# Patient Record
Sex: Female | Born: 1970 | Race: Black or African American | Hispanic: No | Marital: Married | Smoking: Never smoker
Health system: Southern US, Community
[De-identification: ages and names within clinical notes are randomized; demographics above are authoritative.]

## PROBLEM LIST (undated history)

## (undated) DIAGNOSIS — E119 Type 2 diabetes mellitus without complications: Secondary | ICD-10-CM

## (undated) DIAGNOSIS — I10 Essential (primary) hypertension: Secondary | ICD-10-CM

## (undated) HISTORY — PX: TRIGGER FINGER RELEASE: SHX641

---

## 1999-05-13 ENCOUNTER — Emergency Department (HOSPITAL_COMMUNITY): Admission: EM | Admit: 1999-05-13 | Discharge: 1999-05-13 | Payer: Self-pay | Admitting: Emergency Medicine

## 2001-05-05 ENCOUNTER — Emergency Department (HOSPITAL_COMMUNITY): Admission: EM | Admit: 2001-05-05 | Discharge: 2001-05-06 | Payer: Self-pay | Admitting: Emergency Medicine

## 2001-05-13 ENCOUNTER — Other Ambulatory Visit: Admission: RE | Admit: 2001-05-13 | Discharge: 2001-05-13 | Payer: Self-pay | Admitting: Family Medicine

## 2001-08-23 ENCOUNTER — Inpatient Hospital Stay (HOSPITAL_COMMUNITY): Admission: AD | Admit: 2001-08-23 | Discharge: 2001-08-23 | Payer: Self-pay | Admitting: Obstetrics

## 2001-08-24 ENCOUNTER — Encounter: Payer: Self-pay | Admitting: Obstetrics and Gynecology

## 2001-09-03 ENCOUNTER — Other Ambulatory Visit: Admission: RE | Admit: 2001-09-03 | Discharge: 2001-09-03 | Payer: Self-pay | Admitting: Obstetrics and Gynecology

## 2001-11-12 ENCOUNTER — Inpatient Hospital Stay (HOSPITAL_COMMUNITY): Admission: AD | Admit: 2001-11-12 | Discharge: 2001-11-15 | Payer: Self-pay | Admitting: Obstetrics and Gynecology

## 2001-11-15 ENCOUNTER — Encounter: Payer: Self-pay | Admitting: Obstetrics and Gynecology

## 2002-01-03 ENCOUNTER — Inpatient Hospital Stay (HOSPITAL_COMMUNITY): Admission: AD | Admit: 2002-01-03 | Discharge: 2002-01-03 | Payer: Self-pay | Admitting: Obstetrics & Gynecology

## 2002-01-04 ENCOUNTER — Inpatient Hospital Stay (HOSPITAL_COMMUNITY): Admission: AD | Admit: 2002-01-04 | Discharge: 2002-01-04 | Payer: Self-pay | Admitting: *Deleted

## 2002-01-26 ENCOUNTER — Inpatient Hospital Stay (HOSPITAL_COMMUNITY): Admission: AD | Admit: 2002-01-26 | Discharge: 2002-01-26 | Payer: Self-pay | Admitting: Obstetrics & Gynecology

## 2002-02-22 ENCOUNTER — Inpatient Hospital Stay (HOSPITAL_COMMUNITY): Admission: AD | Admit: 2002-02-22 | Discharge: 2002-02-22 | Payer: Self-pay | Admitting: Obstetrics and Gynecology

## 2002-03-07 ENCOUNTER — Inpatient Hospital Stay (HOSPITAL_COMMUNITY): Admission: AD | Admit: 2002-03-07 | Discharge: 2002-03-09 | Payer: Self-pay | Admitting: Obstetrics and Gynecology

## 2002-04-15 ENCOUNTER — Other Ambulatory Visit: Admission: RE | Admit: 2002-04-15 | Discharge: 2002-04-15 | Payer: Self-pay | Admitting: Obstetrics and Gynecology

## 2003-06-19 ENCOUNTER — Other Ambulatory Visit: Admission: RE | Admit: 2003-06-19 | Discharge: 2003-06-19 | Payer: Self-pay | Admitting: Obstetrics and Gynecology

## 2005-12-25 ENCOUNTER — Encounter: Admission: RE | Admit: 2005-12-25 | Discharge: 2005-12-25 | Payer: Self-pay | Admitting: Family Medicine

## 2006-05-11 ENCOUNTER — Other Ambulatory Visit: Admission: RE | Admit: 2006-05-11 | Discharge: 2006-05-11 | Payer: Self-pay | Admitting: Family Medicine

## 2007-05-30 ENCOUNTER — Inpatient Hospital Stay (HOSPITAL_COMMUNITY): Admission: AD | Admit: 2007-05-30 | Discharge: 2007-05-30 | Payer: Self-pay | Admitting: Obstetrics and Gynecology

## 2007-06-16 ENCOUNTER — Encounter (INDEPENDENT_AMBULATORY_CARE_PROVIDER_SITE_OTHER): Payer: Self-pay | Admitting: Obstetrics and Gynecology

## 2007-06-16 ENCOUNTER — Ambulatory Visit (HOSPITAL_COMMUNITY): Admission: RE | Admit: 2007-06-16 | Discharge: 2007-06-16 | Payer: Self-pay | Admitting: Obstetrics and Gynecology

## 2008-11-01 ENCOUNTER — Encounter (INDEPENDENT_AMBULATORY_CARE_PROVIDER_SITE_OTHER): Payer: Self-pay | Admitting: Obstetrics and Gynecology

## 2008-11-01 ENCOUNTER — Ambulatory Visit (HOSPITAL_COMMUNITY): Admission: RE | Admit: 2008-11-01 | Discharge: 2008-11-01 | Payer: Self-pay | Admitting: Obstetrics and Gynecology

## 2009-01-09 ENCOUNTER — Ambulatory Visit: Payer: Self-pay | Admitting: Hematology and Oncology

## 2009-01-11 LAB — CBC WITH DIFFERENTIAL/PLATELET
BASO%: 1.4 % (ref 0.0–2.0)
Basophils Absolute: 0.1 10*3/uL (ref 0.0–0.1)
EOS%: 2.8 % (ref 0.0–7.0)
Eosinophils Absolute: 0.2 10*3/uL (ref 0.0–0.5)
HCT: 43.3 % (ref 34.8–46.6)
HGB: 14.4 g/dL (ref 11.6–15.9)
LYMPH%: 16.4 % (ref 14.0–49.7)
MCH: 28.6 pg (ref 25.1–34.0)
MCHC: 33.2 g/dL (ref 31.5–36.0)
MCV: 86 fL (ref 79.5–101.0)
MONO#: 0.5 10*3/uL (ref 0.1–0.9)
MONO%: 6.2 % (ref 0.0–14.0)
NEUT#: 6 10*3/uL (ref 1.5–6.5)
NEUT%: 73.2 % (ref 38.4–76.8)
Platelets: 209 10*3/uL (ref 145–400)
RBC: 5.03 10*6/uL (ref 3.70–5.45)
RDW: 13.5 % (ref 11.2–14.5)
WBC: 8.1 10*3/uL (ref 3.9–10.3)
lymph#: 1.3 10*3/uL (ref 0.9–3.3)

## 2009-01-15 LAB — COMPREHENSIVE METABOLIC PANEL
ALT: 16 U/L (ref 0–35)
AST: 16 U/L (ref 0–37)
Albumin: 4.2 g/dL (ref 3.5–5.2)
Alkaline Phosphatase: 106 U/L (ref 39–117)
BUN: 16 mg/dL (ref 6–23)
CO2: 27 mEq/L (ref 19–32)
Calcium: 9.2 mg/dL (ref 8.4–10.5)
Chloride: 103 mEq/L (ref 96–112)
Creatinine, Ser: 1.08 mg/dL (ref 0.40–1.20)
Glucose, Bld: 109 mg/dL — ABNORMAL HIGH (ref 70–99)
Potassium: 4 mEq/L (ref 3.5–5.3)
Sodium: 139 mEq/L (ref 135–145)
Total Bilirubin: 0.5 mg/dL (ref 0.3–1.2)
Total Protein: 7.2 g/dL (ref 6.0–8.3)

## 2009-01-15 LAB — HYPERCOAGULABLE PANEL, COMPREHENSIVE RET.
AntiThromb III Func: 107 % (ref 76–126)
Anticardiolipin IgA: 8 [APL'U] (ref <13)
Anticardiolipin IgG: <7 [GPL'U] (ref <11)
Anticardiolipin IgM: 23 [MPL'U] (ref <10)
Beta-2 Glyco I IgG: <4 U/mL (ref <20)
Beta-2-Glycoprotein I IgA: 9 U/mL (ref <10)
Beta-2-Glycoprotein I IgM: 10 U/mL (ref <10)
DRVVT 1:1 Mix: 42.1 secs (ref 36.1–47.0)
DRVVT: 50.3 secs — ABNORMAL HIGH (ref 36.1–47.0)
Homocysteine: 10.5 umol/L (ref 4.0–15.4)
PTT Lupus Anticoagulant: 47.7 secs (ref 36.3–48.8)
Protein C Activity: 139 % — ABNORMAL HIGH (ref 75–133)
Protein C, Total: 74 % (ref 70–140)
Protein S Activity: 137 % — ABNORMAL HIGH (ref 69–129)
Protein S Ag, Total: 106 % (ref 70–140)

## 2009-01-15 LAB — TSH: TSH: 0.8 u[IU]/mL (ref 0.350–4.500)

## 2009-05-11 ENCOUNTER — Ambulatory Visit (HOSPITAL_COMMUNITY): Admission: RE | Admit: 2009-05-11 | Discharge: 2009-05-11 | Payer: Self-pay | Admitting: Obstetrics and Gynecology

## 2010-01-04 ENCOUNTER — Ambulatory Visit: Payer: Self-pay | Admitting: Hematology and Oncology

## 2010-01-04 LAB — CBC WITH DIFFERENTIAL/PLATELET
BASO%: 0.1 % (ref 0.0–2.0)
Basophils Absolute: 0 10*3/uL (ref 0.0–0.1)
EOS%: 1.4 % (ref 0.0–7.0)
Eosinophils Absolute: 0.1 10*3/uL (ref 0.0–0.5)
HCT: 42 % (ref 34.8–46.6)
HGB: 14 g/dL (ref 11.6–15.9)
LYMPH%: 12.7 % — ABNORMAL LOW (ref 14.0–49.7)
MCH: 29 pg (ref 25.1–34.0)
MCHC: 33.3 g/dL (ref 31.5–36.0)
MCV: 87.2 fL (ref 79.5–101.0)
MONO#: 0.8 10*3/uL (ref 0.1–0.9)
MONO%: 7.3 % (ref 0.0–14.0)
NEUT#: 8.2 10*3/uL — ABNORMAL HIGH (ref 1.5–6.5)
NEUT%: 78.5 % — ABNORMAL HIGH (ref 38.4–76.8)
Platelets: 189 10*3/uL (ref 145–400)
RBC: 4.82 10*6/uL (ref 3.70–5.45)
RDW: 14.4 % (ref 11.2–14.5)
WBC: 10.4 10*3/uL — ABNORMAL HIGH (ref 3.9–10.3)
lymph#: 1.3 10*3/uL (ref 0.9–3.3)

## 2010-01-04 LAB — COMPREHENSIVE METABOLIC PANEL
ALT: 25 U/L (ref 0–35)
AST: 27 U/L (ref 0–37)
Albumin: 3.7 g/dL (ref 3.5–5.2)
Alkaline Phosphatase: 65 U/L (ref 39–117)
BUN: 9 mg/dL (ref 6–23)
CO2: 25 mEq/L (ref 19–32)
Calcium: 8.9 mg/dL (ref 8.4–10.5)
Chloride: 105 mEq/L (ref 96–112)
Creatinine, Ser: 1 mg/dL (ref 0.40–1.20)
Glucose, Bld: 77 mg/dL (ref 70–99)
Potassium: 3.8 mEq/L (ref 3.5–5.3)
Sodium: 137 mEq/L (ref 135–145)
Total Bilirubin: 0.6 mg/dL (ref 0.3–1.2)
Total Protein: 7 g/dL (ref 6.0–8.3)

## 2010-01-25 ENCOUNTER — Ambulatory Visit (HOSPITAL_COMMUNITY): Admission: RE | Admit: 2010-01-25 | Discharge: 2010-01-25 | Payer: Self-pay | Admitting: Obstetrics and Gynecology

## 2010-02-05 ENCOUNTER — Ambulatory Visit: Payer: Self-pay | Admitting: Hematology and Oncology

## 2010-02-06 LAB — CBC WITH DIFFERENTIAL/PLATELET
BASO%: 0.3 % (ref 0.0–2.0)
Basophils Absolute: 0 10*3/uL (ref 0.0–0.1)
EOS%: 1.6 % (ref 0.0–7.0)
Eosinophils Absolute: 0.2 10*3/uL (ref 0.0–0.5)
HCT: 40.5 % (ref 34.8–46.6)
HGB: 13.6 g/dL (ref 11.6–15.9)
LYMPH%: 12.9 % — ABNORMAL LOW (ref 14.0–49.7)
MCH: 28.7 pg (ref 25.1–34.0)
MCHC: 33.4 g/dL (ref 31.5–36.0)
MCV: 85.8 fL (ref 79.5–101.0)
MONO#: 0.7 10*3/uL (ref 0.1–0.9)
MONO%: 7 % (ref 0.0–14.0)
NEUT#: 7.3 10*3/uL — ABNORMAL HIGH (ref 1.5–6.5)
NEUT%: 78.2 % — ABNORMAL HIGH (ref 38.4–76.8)
Platelets: 164 10*3/uL (ref 145–400)
RBC: 4.72 10*6/uL (ref 3.70–5.45)
RDW: 14 % (ref 11.2–14.5)
WBC: 9.3 10*3/uL (ref 3.9–10.3)
lymph#: 1.2 10*3/uL (ref 0.9–3.3)

## 2010-02-06 LAB — BASIC METABOLIC PANEL
BUN: 9 mg/dL (ref 6–23)
CO2: 26 mEq/L (ref 19–32)
Calcium: 8.9 mg/dL (ref 8.4–10.5)
Chloride: 105 mEq/L (ref 96–112)
Creatinine, Ser: 1.03 mg/dL (ref 0.40–1.20)
Glucose, Bld: 94 mg/dL (ref 70–99)
Potassium: 3.9 mEq/L (ref 3.5–5.3)
Sodium: 137 mEq/L (ref 135–145)

## 2010-03-02 ENCOUNTER — Inpatient Hospital Stay (HOSPITAL_COMMUNITY): Admission: AD | Admit: 2010-03-02 | Discharge: 2010-03-03 | Payer: Self-pay | Admitting: Obstetrics and Gynecology

## 2010-03-02 ENCOUNTER — Encounter (INDEPENDENT_AMBULATORY_CARE_PROVIDER_SITE_OTHER): Payer: Self-pay | Admitting: Obstetrics and Gynecology

## 2010-03-05 LAB — CBC WITH DIFFERENTIAL/PLATELET
BASO%: 0.3 % (ref 0.0–2.0)
Basophils Absolute: 0 10*3/uL (ref 0.0–0.1)
EOS%: 3.3 % (ref 0.0–7.0)
Eosinophils Absolute: 0.3 10*3/uL (ref 0.0–0.5)
HCT: 34.8 % (ref 34.8–46.6)
HGB: 11.7 g/dL (ref 11.6–15.9)
LYMPH%: 15.1 % (ref 14.0–49.7)
MCH: 29.1 pg (ref 25.1–34.0)
MCHC: 33.5 g/dL (ref 31.5–36.0)
MCV: 86.7 fL (ref 79.5–101.0)
MONO#: 0.7 10*3/uL (ref 0.1–0.9)
MONO%: 7.5 % (ref 0.0–14.0)
NEUT#: 6.7 10*3/uL — ABNORMAL HIGH (ref 1.5–6.5)
NEUT%: 73.8 % (ref 38.4–76.8)
Platelets: 181 10*3/uL (ref 145–400)
RBC: 4.02 10*6/uL (ref 3.70–5.45)
RDW: 14.3 % (ref 11.2–14.5)
WBC: 9.1 10*3/uL (ref 3.9–10.3)
lymph#: 1.4 10*3/uL (ref 0.9–3.3)

## 2010-03-29 DEATH — deceased

## 2010-09-17 ENCOUNTER — Encounter
Admission: RE | Admit: 2010-09-17 | Discharge: 2010-09-17 | Payer: Self-pay | Source: Home / Self Care | Attending: Obstetrics and Gynecology | Admitting: Obstetrics and Gynecology

## 2010-11-20 ENCOUNTER — Other Ambulatory Visit: Payer: Self-pay | Admitting: Hematology and Oncology

## 2010-11-20 ENCOUNTER — Encounter (HOSPITAL_BASED_OUTPATIENT_CLINIC_OR_DEPARTMENT_OTHER): Payer: Self-pay | Admitting: Hematology and Oncology

## 2010-11-20 DIAGNOSIS — Z7901 Long term (current) use of anticoagulants: Secondary | ICD-10-CM

## 2010-11-20 DIAGNOSIS — R894 Abnormal immunological findings in specimens from other organs, systems and tissues: Secondary | ICD-10-CM

## 2010-11-20 LAB — COMPREHENSIVE METABOLIC PANEL
ALT: 23 U/L (ref 0–35)
AST: 23 U/L (ref 0–37)
Albumin: 3.9 g/dL (ref 3.5–5.2)
Alkaline Phosphatase: 82 U/L (ref 39–117)
BUN: 11 mg/dL (ref 6–23)
CO2: 21 mEq/L (ref 19–32)
Calcium: 9.4 mg/dL (ref 8.4–10.5)
Chloride: 104 mEq/L (ref 96–112)
Creatinine, Ser: 0.94 mg/dL (ref 0.40–1.20)
Glucose, Bld: 79 mg/dL (ref 70–99)
Potassium: 4.4 mEq/L (ref 3.5–5.3)
Sodium: 138 mEq/L (ref 135–145)
Total Bilirubin: 0.5 mg/dL (ref 0.3–1.2)
Total Protein: 6.8 g/dL (ref 6.0–8.3)

## 2010-11-20 LAB — CBC WITH DIFFERENTIAL/PLATELET
BASO%: 0.4 % (ref 0.0–2.0)
Basophils Absolute: 0 10*3/uL (ref 0.0–0.1)
EOS%: 2.8 % (ref 0.0–7.0)
Eosinophils Absolute: 0.3 10*3/uL (ref 0.0–0.5)
HCT: 42.8 % (ref 34.8–46.6)
HGB: 14.1 g/dL (ref 11.6–15.9)
LYMPH%: 14.4 % (ref 14.0–49.7)
MCH: 28.3 pg (ref 25.1–34.0)
MCHC: 32.9 g/dL (ref 31.5–36.0)
MCV: 86.1 fL (ref 79.5–101.0)
MONO#: 0.6 10*3/uL (ref 0.1–0.9)
MONO%: 6.7 % (ref 0.0–14.0)
NEUT#: 6.9 10*3/uL — ABNORMAL HIGH (ref 1.5–6.5)
NEUT%: 75.7 % (ref 38.4–76.8)
Platelets: 170 10*3/uL (ref 145–400)
RBC: 4.97 10*6/uL (ref 3.70–5.45)
RDW: 14 % (ref 11.2–14.5)
WBC: 9.2 10*3/uL (ref 3.9–10.3)
lymph#: 1.3 10*3/uL (ref 0.9–3.3)

## 2010-12-09 ENCOUNTER — Other Ambulatory Visit: Payer: Self-pay | Admitting: Obstetrics and Gynecology

## 2010-12-16 LAB — CBC
HCT: 38.8 % (ref 36.0–46.0)
Hemoglobin: 13.2 g/dL (ref 12.0–15.0)
MCHC: 34 g/dL (ref 30.0–36.0)
MCV: 87.7 fL (ref 78.0–100.0)
Platelets: 144 10*3/uL — ABNORMAL LOW (ref 150–400)
RBC: 4.43 MIL/uL (ref 3.87–5.11)
RDW: 13.8 % (ref 11.5–15.5)
WBC: 13.2 10*3/uL — ABNORMAL HIGH (ref 4.0–10.5)

## 2010-12-16 LAB — DIFFERENTIAL
Basophils Absolute: 0 10*3/uL (ref 0.0–0.1)
Basophils Relative: 0 % (ref 0–1)
Eosinophils Absolute: 0.1 10*3/uL (ref 0.0–0.7)
Eosinophils Relative: 1 % (ref 0–5)
Lymphocytes Relative: 9 % — ABNORMAL LOW (ref 12–46)
Lymphs Abs: 1.1 10*3/uL (ref 0.7–4.0)
Monocytes Absolute: 0.7 10*3/uL (ref 0.1–1.0)
Monocytes Relative: 5 % (ref 3–12)
Neutro Abs: 11.2 10*3/uL — ABNORMAL HIGH (ref 1.7–7.7)
Neutrophils Relative %: 85 % — ABNORMAL HIGH (ref 43–77)

## 2010-12-16 LAB — URINE CULTURE
Colony Count: >=100000
Special Requests: NEGATIVE

## 2010-12-16 LAB — MRSA PCR SCREENING: MRSA by PCR: NEGATIVE

## 2010-12-17 LAB — CBC
HCT: 42.6 % (ref 36.0–46.0)
Hemoglobin: 14.4 g/dL (ref 12.0–15.0)
MCHC: 33.8 g/dL (ref 30.0–36.0)
MCV: 86.7 fL (ref 78.0–100.0)
Platelets: 169 10*3/uL (ref 150–400)
RBC: 4.91 MIL/uL (ref 3.87–5.11)
RDW: 14 % (ref 11.5–15.5)
WBC: 10 10*3/uL (ref 4.0–10.5)

## 2011-01-03 ENCOUNTER — Ambulatory Visit (HOSPITAL_COMMUNITY)
Admission: RE | Admit: 2011-01-03 | Discharge: 2011-01-03 | Disposition: A | Discharge status: Home / Self Care | Payer: 59 | Source: Ambulatory Visit | Attending: Obstetrics and Gynecology | Admitting: Obstetrics and Gynecology

## 2011-01-03 DIAGNOSIS — O3430 Maternal care for cervical incompetence, unspecified trimester: Secondary | ICD-10-CM | POA: Insufficient documentation

## 2011-01-03 LAB — CBC
HCT: 39.3 % (ref 36.0–46.0)
Hemoglobin: 13 g/dL (ref 12.0–15.0)
MCH: 28.4 pg (ref 26.0–34.0)
MCHC: 33.1 g/dL (ref 30.0–36.0)
MCV: 85.8 fL (ref 78.0–100.0)
Platelets: 169 10*3/uL (ref 150–400)
RBC: 4.58 MIL/uL (ref 3.87–5.11)
RDW: 13.6 % (ref 11.5–15.5)
WBC: 9.8 10*3/uL (ref 4.0–10.5)

## 2011-01-14 LAB — CBC
HCT: 45.3 % (ref 36.0–46.0)
Hemoglobin: 15 g/dL (ref 12.0–15.0)
MCHC: 33.1 g/dL (ref 30.0–36.0)
MCV: 87.4 fL (ref 78.0–100.0)
Platelets: 199 10*3/uL (ref 150–400)
RBC: 5.18 MIL/uL — ABNORMAL HIGH (ref 3.87–5.11)
RDW: 14.3 % (ref 11.5–15.5)
WBC: 10.3 10*3/uL (ref 4.0–10.5)

## 2011-01-15 NOTE — Op Note (Signed)
  [name], [name]                 ACCOUNT NO.:  [id number]  MEDICAL RECORD NO.:  [id number]           PATIENT TYPE:  O  LOCATION:  [facility]                          [facility]  PHYSICIAN:  Maxie Better, M.D.DATE OF BIRTH:  22-Aug-1971  DATE OF PROCEDURE:  01/03/2011 DATE OF DISCHARGE:                              OPERATIVE REPORT   PREOPERATIVE DIAGNOSIS:  Cervical incompetence, intrauterine gestation at 13+ weeks.  POSTOPERATIVE DIAGNOSIS:  Cervical incompetence, intrauterine gestation at 13+ weeks.  PROCEDURE:  McDonald cerclage placement.  ANESTHESIA:  Spinal.  SURGEON:  Maxie Better, MD  ASSISTANT:  None.  PROCEDURE IN DETAIL:  Under adequate spinal anesthesia, the patient was placed in the dorsal lithotomy position.  She was sterilely prepped and draped in usual fashion.  The bladder was catheterized for moderate amount of urine.  A weighted speculum was then placed in the vagina. The cervix which was closed but very bulbous.  The anterior and posterior lip was grasped with a straight clamp.  A #1 Prolene was used to place the McDonald suture starting at the 12 o'clock positions in usual fashion.  Ethicon suture was then used to tag the base.  The cerclage was then tied with the knot at the 12 o'clock position.  At that point, the procedure was felt to have been complete, the cervix remained closed, and all instruments were then removed from the vagina. Specimen was none.  Estimated blood loss was minimal.  Complications none.  The patient tolerated the procedure well and was transferred to recovery in stable condition.  Fetal heart rate was obtained at 116 in the recovery room.     Maxie Better, M.D.     [place]/MEDQ  D:  01/03/2011  T:  01/04/2011  Job:  [id number]  Electronically Signed by Nena Jordan Shaquelle Hernon M.D. on 01/15/2011 04:10:37 PM

## 2011-02-11 NOTE — Op Note (Signed)
Sherry [name], Sherry [name]                 ACCOUNT NO.:  [id number]   MEDICAL RECORD NO.:  [id number]          PATIENT TYPE:  AMB   LOCATION:  [facility]                           [facility]   PHYSICIAN:  IT trainer, M.D.DATE OF BIRTH:  [birth year]   DATE OF PROCEDURE:  DATE OF DISCHARGE:                               OPERATIVE REPORT   PREOPERATIVE DIAGNOSIS:  Missed abortion, recurrent pregnancy loss.   PROCEDURE:  Ultrasound-guided suction dilation and evacuation with  chromosomal studies.   POSTOPERATIVE DIAGNOSIS:  Missed abortion, recurrent pregnancy loss.   ANESTHESIA:  MAC, paracervical block.   SURGEON:  Maxie Better, MD   PROCEDURE:  Under adequate monitored anesthesia, the patient was placed  in the dorsal lithotomy position.  She was sterilely prepped and draped  in usual fashion.  Bladder catheterized large amount of urine.  Examination under anesthesia revealed an anteverted 8-10 week size  uterus.  No adnexal masses could be appreciated.  Bivalve speculum was  placed in vagina.  A 21 mL of 1% Nesacaine was injected at 3 and 9  o'clock position.  Anterior lip of the cervix was grasped with a single-  tooth tenaculum.  Ultrasound was then utilized; however, due to the  bladder being emptied, it was difficult visualization.  Nonetheless, the  cervix was serially dilated up to #31 [facility] dilator.  A #8 mm curved  suction cannula was introduced in the uterine cavity.  Tissue was  obtained due to the difficulty.  Instruments removed from the vagina.  Sterile application of the transvaginal probe was then utilized at which  time the endometrial stripe was noted to be thin.  At that point, the  procedure was felt to have been completed and all instruments were then  removed.  Specimen labeled products of conception and portion of this  was sent to pathology, as well as for chromosomal studies.  Estimated  blood loss was minimal.  Complications, none.  Maternal blood type  was  O+.  The patient tolerated the procedure well and was transferred to  recovery room in stable condition.      Maxie Better, M.D.  Electronically Signed     [place]/MEDQ  D:  11/01/2008  T:  11/02/2008  Job:  [id number]

## 2011-02-11 NOTE — Op Note (Signed)
NAMEPROVIDENCE, STIVERS                 ACCOUNT NO.:  [id number]   MEDICAL RECORD NO.:  [id number]          PATIENT TYPE:  AMB   LOCATION:  [facility]                           [facility]   PHYSICIAN:  Maxie Better, M.D.DATE OF BIRTH:  [birth year]   DATE OF PROCEDURE:  06/16/2007  DATE OF DISCHARGE:                               OPERATIVE REPORT   PREOPERATIVE DIAGNOSIS:  Abnormal pregnancy/missed abortion,  oligohydramnios.   PROCEDURE:  Ultrasound guided suction dilation and evacuation.   POSTOPERATIVE DIAGNOSIS:  Abnormal pregnancy/missed abortion,  oligohydramnios, pending final pathology.   ANESTHESIA:  General.   SURGEON:  Maxie Better, M.D.   DESCRIPTION OF PROCEDURE:  Under adequate general anesthesia, the  patient was placed in the dorsal lithotomy position.  She was sterilely  prepped and draped in the usual fashion.  The bladder was not  catheterized in order to facilitate the ultrasound guidance. A bivalve  speculum placed in vagina.  10 mL of 1% lidocaine was injected  paracervically at the 3 and 9 o'clock position.  The cervix was then  grasped with a single tooth tenaculum.  The cervix was then serially  dilated and #7 mm curved suction cannula was introduced into the uterine  cavity with ultrasound guidance. The sac that was seen was evacuated,  the cavity suctioned and curetted until all tissue was felt to be  removed, at which time all instruments were then removed from the  vagina.  The specimen labeled products of conception was sent to  pathology.  Estimated blood loss was minimal.  Complication was none.  The patient tolerated the procedure well and was transferred to the  recovery room in stable condition.      Maxie Better, M.D.  Electronically Signed     [place]/MEDQ  D:  06/16/2007  T:  06/16/2007  Job:  [id number]

## 2011-02-14 NOTE — Op Note (Signed)
[facility]  Patient:    Sherry [name], Sherry [name] Visit Number: [id number] MRN: [id number]          Service Type: MED Location: [id number] Attending Physician:  Lenoard Aden Dictated by:   Sheria Lang Cherly Hensen, M.D. Proc. Date: 11/12/01 Admit Date:  11/12/2001                             Operative Report  PREOPERATIVE DIAGNOSIS:       Cervical incompetence, intrauterine gestation at 21-1/7 weeks.  POSTOPERATIVE DIAGNOSIS:      Incompetent cervix, intrauterine gestation at 21-1/7 weeks.  OPERATION:                    Emergency McDonald cerclage placement.  SURGEON:                      Sheronette A. Cherly Hensen, M.D.  ANESTHESIA:                   Spinal.  INDICATIONS:                  This is a 40 year old, gravida 4, para 1-0-2-1 female at 21-1/[redacted] weeks gestation who presented for anatomic fetal survey and was found to have cervical length of 1.6 cm.  The patient had one elective termination.  She had a term vaginal delivery nine years ago.  Given this shortened cervical length.  Decision was made to proceed with cerclage placement.  The risks and benefits of the procedure have been explained to the patient and her fiance.  Consent was signed.  The patient was transferred to the operating room.  DESCRIPTION OF PROCEDURE:  Under adequate spinal anesthesia, the patient was placed in the dorsal lithotomy position.  Examination under anesthesia revealed a cervical that was 1 cm dilated with about 2 cm in length.  The patient was sterilely prepped and draped in the usual fashion.  The vagina was prepped with sterile saline solution and weighted speculum was then placed. Sims retractor was then used anteriorly and the posterior lip of the cervix was grasped with the Lee clamps.  At that point, the membranes were found to be slightly visualized,  With visualization of the membranes, decision was then made to use a 30 cc [place] Foley balloon to push the  membranes out of the operating site and #1 Prolene was then placed in a [employer] cerclage fashion.  The Foley catheter was deflated and removed slowly with closure of the internal os without incident.  The knot was placed anteriorly at 12 oclock.  The procedure was terminated by removing all instruments from the vagina.  No specimens.  Estimated blood loss was minimal.  No complications. The patient tolerated the procedure well and was transferred to the recovery room in stable condition.  DESCRIPTION OF PROCEDURE: Dictated by:   Sheronette A. Cherly Hensen, M.D. Attending Physician:  Lenoard Aden DD:  11/12/01 TD:  11/13/01 Job: [id number]

## 2011-03-12 ENCOUNTER — Inpatient Hospital Stay (HOSPITAL_COMMUNITY): Payer: 59

## 2011-03-12 ENCOUNTER — Inpatient Hospital Stay (HOSPITAL_COMMUNITY)
Admission: AD | Admit: 2011-03-12 | Discharge: 2011-03-12 | Disposition: A | Discharge status: Home / Self Care | Payer: 59 | Source: Ambulatory Visit | Attending: Obstetrics and Gynecology | Admitting: Obstetrics and Gynecology

## 2011-03-12 ENCOUNTER — Inpatient Hospital Stay (HOSPITAL_COMMUNITY)
Admission: AD | Admit: 2011-03-12 | Discharge: 2011-04-01 | DRG: 765 | Disposition: A | Discharge status: Home / Self Care | Payer: 59 | Source: Ambulatory Visit | Attending: Obstetrics and Gynecology | Admitting: Obstetrics and Gynecology

## 2011-03-12 DIAGNOSIS — Z0489 Encounter for examination and observation for other specified reasons: Secondary | ICD-10-CM

## 2011-03-12 DIAGNOSIS — O99891 Other specified diseases and conditions complicating pregnancy: Secondary | ICD-10-CM

## 2011-03-12 DIAGNOSIS — O34599 Maternal care for other abnormalities of gravid uterus, unspecified trimester: Secondary | ICD-10-CM | POA: Diagnosis present

## 2011-03-12 DIAGNOSIS — O26879 Cervical shortening, unspecified trimester: Principal | ICD-10-CM | POA: Diagnosis present

## 2011-03-12 DIAGNOSIS — O2690 Pregnancy related conditions, unspecified, unspecified trimester: Secondary | ICD-10-CM

## 2011-03-12 DIAGNOSIS — IMO0002 Reserved for concepts with insufficient information to code with codable children: Secondary | ICD-10-CM

## 2011-03-12 DIAGNOSIS — Z8751 Personal history of pre-term labor: Secondary | ICD-10-CM

## 2011-03-12 DIAGNOSIS — O09529 Supervision of elderly multigravida, unspecified trimester: Secondary | ICD-10-CM

## 2011-03-12 DIAGNOSIS — O321XX0 Maternal care for breech presentation, not applicable or unspecified: Secondary | ICD-10-CM | POA: Diagnosis present

## 2011-03-12 DIAGNOSIS — O368190 Decreased fetal movements, unspecified trimester, not applicable or unspecified: Secondary | ICD-10-CM | POA: Diagnosis not present

## 2011-03-12 DIAGNOSIS — D4959 Neoplasm of unspecified behavior of other genitourinary organ: Secondary | ICD-10-CM | POA: Diagnosis present

## 2011-03-12 DIAGNOSIS — D259 Leiomyoma of uterus, unspecified: Secondary | ICD-10-CM | POA: Diagnosis present

## 2011-03-12 DIAGNOSIS — O3430 Maternal care for cervical incompetence, unspecified trimester: Secondary | ICD-10-CM | POA: Diagnosis present

## 2011-03-12 DIAGNOSIS — O288 Other abnormal findings on antenatal screening of mother: Secondary | ICD-10-CM

## 2011-03-12 DIAGNOSIS — O4290 Premature rupture of membranes, unspecified as to length of time between rupture and onset of labor, unspecified weeks of gestation: Secondary | ICD-10-CM | POA: Diagnosis present

## 2011-03-12 LAB — URINALYSIS, MICROSCOPIC ONLY
Bilirubin Urine: NEGATIVE
Glucose, UA: NEGATIVE mg/dL
Ketones, ur: 15 mg/dL — AB
Nitrite: NEGATIVE
Protein, ur: NEGATIVE mg/dL
Specific Gravity, Urine: 1.01 (ref 1.005–1.030)
Urobilinogen, UA: 0.2 mg/dL (ref 0.0–1.0)
pH: 7 (ref 5.0–8.0)

## 2011-03-13 LAB — URINE CULTURE
Colony Count: 80000
Culture  Setup Time: 201206140116
Special Requests: NEGATIVE

## 2011-03-13 LAB — CBC
HCT: 37.6 % (ref 36.0–46.0)
Hemoglobin: 12.6 g/dL (ref 12.0–15.0)
MCH: 29.1 pg (ref 26.0–34.0)
MCHC: 33.5 g/dL (ref 30.0–36.0)
MCV: 86.8 fL (ref 78.0–100.0)
Platelets: 161 10*3/uL (ref 150–400)
RBC: 4.33 MIL/uL (ref 3.87–5.11)
RDW: 14 % (ref 11.5–15.5)
WBC: 11.4 10*3/uL — ABNORMAL HIGH (ref 4.0–10.5)

## 2011-03-13 LAB — DIFFERENTIAL
Basophils Absolute: 0 10*3/uL (ref 0.0–0.1)
Basophils Relative: 0 % (ref 0–1)
Eosinophils Absolute: 0.2 10*3/uL (ref 0.0–0.7)
Eosinophils Relative: 2 % (ref 0–5)
Lymphocytes Relative: 12 % (ref 12–46)
Lymphs Abs: 1.3 10*3/uL (ref 0.7–4.0)
Monocytes Absolute: 0.8 10*3/uL (ref 0.1–1.0)
Monocytes Relative: 7 % (ref 3–12)
Neutro Abs: 9.1 10*3/uL — ABNORMAL HIGH (ref 1.7–7.7)
Neutrophils Relative %: 79 % — ABNORMAL HIGH (ref 43–77)

## 2011-03-14 ENCOUNTER — Inpatient Hospital Stay (HOSPITAL_COMMUNITY): Payer: 59

## 2011-03-14 ENCOUNTER — Other Ambulatory Visit (HOSPITAL_COMMUNITY): Payer: 59

## 2011-03-14 LAB — STREP B DNA PROBE: Strep Group B Ag: POSITIVE

## 2011-03-21 ENCOUNTER — Inpatient Hospital Stay (HOSPITAL_COMMUNITY): Payer: 59

## 2011-03-21 LAB — DIFFERENTIAL
Basophils Absolute: 0 10*3/uL (ref 0.0–0.1)
Basophils Relative: 0 % (ref 0–1)
Eosinophils Absolute: 0.2 10*3/uL (ref 0.0–0.7)
Eosinophils Relative: 1 % (ref 0–5)
Lymphocytes Relative: 10 % — ABNORMAL LOW (ref 12–46)
Lymphs Abs: 1.5 10*3/uL (ref 0.7–4.0)
Monocytes Absolute: 0.9 10*3/uL (ref 0.1–1.0)
Monocytes Relative: 6 % (ref 3–12)
Neutro Abs: 12.2 10*3/uL — ABNORMAL HIGH (ref 1.7–7.7)
Neutrophils Relative %: 82 % — ABNORMAL HIGH (ref 43–77)

## 2011-03-21 LAB — CBC
HCT: 38.1 % (ref 36.0–46.0)
Hemoglobin: 12.6 g/dL (ref 12.0–15.0)
MCH: 28.6 pg (ref 26.0–34.0)
MCHC: 33.1 g/dL (ref 30.0–36.0)
MCV: 86.4 fL (ref 78.0–100.0)
Platelets: 176 10*3/uL (ref 150–400)
RBC: 4.41 MIL/uL (ref 3.87–5.11)
RDW: 14.1 % (ref 11.5–15.5)
WBC: 14.8 10*3/uL — ABNORMAL HIGH (ref 4.0–10.5)

## 2011-03-21 LAB — MAGNESIUM: Magnesium: 4.4 mg/dL — ABNORMAL HIGH (ref 1.5–2.5)

## 2011-03-22 LAB — CBC
HCT: 37.2 % (ref 36.0–46.0)
Hemoglobin: 12.3 g/dL (ref 12.0–15.0)
MCH: 28.5 pg (ref 26.0–34.0)
MCHC: 33.1 g/dL (ref 30.0–36.0)
MCV: 86.3 fL (ref 78.0–100.0)
Platelets: 176 10*3/uL (ref 150–400)
RBC: 4.31 MIL/uL (ref 3.87–5.11)
RDW: 13.9 % (ref 11.5–15.5)
WBC: 13.1 10*3/uL — ABNORMAL HIGH (ref 4.0–10.5)

## 2011-03-25 ENCOUNTER — Encounter (HOSPITAL_COMMUNITY): Payer: Self-pay | Admitting: Radiology

## 2011-03-25 ENCOUNTER — Other Ambulatory Visit (HOSPITAL_COMMUNITY): Payer: 59

## 2011-03-25 ENCOUNTER — Inpatient Hospital Stay (HOSPITAL_COMMUNITY): Payer: 59

## 2011-03-25 ENCOUNTER — Ambulatory Visit (HOSPITAL_COMMUNITY): Payer: 59

## 2011-03-25 LAB — DIFFERENTIAL
Basophils Absolute: 0 10*3/uL (ref 0.0–0.1)
Basophils Relative: 0 % (ref 0–1)
Eosinophils Absolute: 0.3 10*3/uL (ref 0.0–0.7)
Eosinophils Relative: 2 % (ref 0–5)
Lymphocytes Relative: 10 % — ABNORMAL LOW (ref 12–46)
Lymphs Abs: 1.4 10*3/uL (ref 0.7–4.0)
Monocytes Absolute: 0.8 10*3/uL (ref 0.1–1.0)
Monocytes Relative: 6 % (ref 3–12)
Neutro Abs: 10.8 10*3/uL — ABNORMAL HIGH (ref 1.7–7.7)
Neutrophils Relative %: 82 % — ABNORMAL HIGH (ref 43–77)

## 2011-03-25 LAB — CBC
HCT: 38.4 % (ref 36.0–46.0)
Hemoglobin: 12.5 g/dL (ref 12.0–15.0)
MCH: 28.3 pg (ref 26.0–34.0)
MCHC: 32.6 g/dL (ref 30.0–36.0)
MCV: 87.1 fL (ref 78.0–100.0)
Platelets: 176 10*3/uL (ref 150–400)
RBC: 4.41 MIL/uL (ref 3.87–5.11)
RDW: 14 % (ref 11.5–15.5)
WBC: 13.2 10*3/uL — ABNORMAL HIGH (ref 4.0–10.5)

## 2011-03-28 ENCOUNTER — Inpatient Hospital Stay (HOSPITAL_COMMUNITY): Payer: 59

## 2011-03-29 LAB — CBC
HCT: 36.8 % (ref 36.0–46.0)
Hemoglobin: 12.1 g/dL (ref 12.0–15.0)
MCH: 28.5 pg (ref 26.0–34.0)
MCHC: 32.9 g/dL (ref 30.0–36.0)
MCV: 86.6 fL (ref 78.0–100.0)
Platelets: 169 10*3/uL (ref 150–400)
RBC: 4.25 MIL/uL (ref 3.87–5.11)
RDW: 14.1 % (ref 11.5–15.5)
WBC: 13.6 10*3/uL — ABNORMAL HIGH (ref 4.0–10.5)

## 2011-03-30 ENCOUNTER — Inpatient Hospital Stay (HOSPITAL_COMMUNITY): Payer: 59

## 2011-03-30 ENCOUNTER — Other Ambulatory Visit: Payer: Self-pay | Admitting: Obstetrics and Gynecology

## 2011-03-31 LAB — CBC
HCT: 34.4 % — ABNORMAL LOW (ref 36.0–46.0)
Hemoglobin: 11 g/dL — ABNORMAL LOW (ref 12.0–15.0)
MCH: 28 pg (ref 26.0–34.0)
MCHC: 32 g/dL (ref 30.0–36.0)
MCV: 87.5 fL (ref 78.0–100.0)
Platelets: 150 10*3/uL (ref 150–400)
RBC: 3.93 MIL/uL (ref 3.87–5.11)
RDW: 14 % (ref 11.5–15.5)
WBC: 12.1 10*3/uL — ABNORMAL HIGH (ref 4.0–10.5)

## 2011-03-31 NOTE — Op Note (Signed)
Sherry [name], CIARAVINO                 ACCOUNT NO.:  [id number]  MEDICAL RECORD NO.:  [id number]  LOCATION:  9303                          [facility]  PHYSICIAN:  Maxie Better, M.D.DATE OF BIRTH:  01/17/1971  DATE OF PROCEDURE:  03/30/2011 DATE OF DISCHARGE:                              OPERATIVE REPORT   PREOPERATIVE DIAGNOSES:  Biophysical profile 2/8, prolonged preterm premature rupture of membranes, breech presentation, cervical incompetence, intrauterine gestation at 25-3/7th weeks' gestation.  PROCEDURES:  Primary cesarean section, classical hysterotomy, myomectomy, and removal of cervical cerclage.  POSTOPERATIVE DIAGNOSES:  Homero Fellers breech presentation, intrauterine gestation at 25-3/7th weeks, prolonged premature rupture of membranes, biophysical profile at 2/8, cervical incompetence, and fibroid uterus.  ANESTHESIA:  Spinal.  SURGEON:  Maxie Better, MD  ASSISTANT:  Marlinda Mike, CNM  PROCEDURE:  Under adequate spinal anesthesia, the patient was placed in supine position with a left lateral tilt.  She was sterilely prepped and draped in the usual fashion.  Indwelling Foley catheter was sterilely placed.  Marcaine 0.25% was injected along the planned Pfannenstiel skin incision site.  Pfannenstiel skin incision was then made and carried down to the rectus fascia.  Rectus fascia was opened transversely. Rectus fascia was then bluntly and sharply dissected off the rectus muscles in a superior and inferior fashion.  The rectus muscles was split in the midline.  The parietal peritoneum was entered bluntly and extended.  On entering the abdominal cavity, the uterus was about a 20- week size, undeveloped lower uterine segment.  Decision was made to perform a classical hysterotomy.  This was started in the midline without developing a bladder flap.  The incision was carried superiorly and inferiorly with subsequent notation of the anterior placenta through which I  went through and subsequently found the fetus in a frank breech position who was delivered in that position carefully.  Cord around the neck x2 and along the chest, all of which was reduced.  Cord was clamped and cut.  The baby was transferred to the awaiting pediatrician who assigned Apgars of 5 at 1 minutes, 6 at 5 minutes, 7 at 10 minutes.  The placenta which was small and ratty was manually removed.  Cord pH had been obtained and cord bloods were obtained.  In performed the classical hysterotomy, I went through several uterine fibroids.  The uterus itself was diffusely leiomyomatous with several fibroids along the incisions that need to be closed.  At that point, decision was then made to remove those  in order to facilitate the closure.  The fibroid was isolated and removed from the bases, one of which was projecting into the submucosal area anteriorly and that was removed as well.  The cavity was cleaned of debris.  The uterine incision was then closed with 0 Monocryl running stitch first layer, second layer with also 0 Monocryl running stitch and several interrupted figure-of-eight sutures were placed along the myometrium followed by 3-0 Monocryl for a baseball closure of the serosal surface.  Bleeding superiorly was hemostasis with a U-shaped 3-0 Monocryl figure-of-eight suture.  Normal tubes and ovaries were noted bilaterally.  The abdomen was then copiously irrigated and suctioned of debris.  Interceed  was placed overlying the vertical uterine incision. The parietal peritoneum was then closed with 2-0 Vicryl.  The rectus fascia was then closed with 0 Vicryl x2.  The subcutaneous area was irrigated, small bleeders were cauterized.  Interrupted 2-0 plain sutures was then placed and the skin was approximated using Ethicon staples.  At that point, the patient was placed in the dorsal lithotomy position.  Speculum was placed.  The cerclage suture was noted anteriorly. This was removed  without difficulty.  The area from where the cerclage site  was checked for bleeding and defect.  Hemostasis subsequently noted.  Bimanual exam revealed the cervix to be about 2 cm dilated with no evidence of the defect going through and through the cervix.  At that point, the procedures were felt to be complete.  Specimen was placenta amd myomas sent to Pathology.   Cord pH was 7.26. Intraoperative fluid 1900 mL.  Urine output 400 mL clear yellow urine. Estimated blood loss 500 mL.  Baby was transferred to the Neonatal Intensive Care Unit intubated.  Weight of the baby was 1 pound and 4 ounces (580 g).  Sponge and instrument counts x2 was correct. Complications were none.  The patient was transferred to the recovery room in stable condition.     Maxie Better, M.D.     [place]/MEDQ  D:  03/30/2011  T:  03/31/2011  Job:  [id number]  Electronically Signed by Nena Jordan Leotis Isham M.D. on 03/31/2011 04:10:27 PM

## 2011-04-19 NOTE — Discharge Summary (Signed)
Sherry [name], Sherry [name]                 ACCOUNT NO.:  [id number]  MEDICAL RECORD NO.:  [id number]  LOCATION:  9303                          [facility]  PHYSICIAN:  Maxie Better, M.D.DATE OF BIRTH:  09-17-1971  DATE OF ADMISSION:  03/12/2011 DATE OF DISCHARGE:  04/01/2011                              DISCHARGE SUMMARY   ADMISSION DIAGNOSES:  Intrauterine pregnancy at 22 weeks and 6 days' gestation, short cervix, cerclage in place dilated.  DISCHARGE DIAGNOSIS:  Postoperative day #2 status post primary cesarean section for breech presentation at 25 weeks and 5 days' gestation, PPROM, abnormal antepartum testing, Cervical incompetence, Preterm delivered.  PATIENT HISTORY:  The patient is a gravida 6, para 1-1-3-2 at 22 weeks and 6 days' gestation with [facility] of July 10, 2011.  Prenatal care was obtained at [facility] since 9 weeks and 1 day gestation with Dr. Cherly Hensen as primary.  Prenatal labs include O positive, rubella immune, GBS unknown, HIV negative, RPR negative, hepatitis B negative.  Prenatal course was complicated by incompetent cervix.  The patient had a cervical cerclage placement on January 03, 2011, subsequently started on 17-OHP injections on Feb 27, 2011.  Also history of PCOS and uterine fibroids.  MEDICAL SURGICAL HISTORY:  The patient is obese, PCOS, history of syphilis, history of koilocytic atypia, and history of cerclage placement.  ALLERGIES:  ASPIRIN and SEA FOOD.  CURRENT MEDICATIONS:  Prenatal vitamins and 17-OHP injections.  PRESENTATION:  The patient was a direct admit from office for incompetent cervix.  Membranes visible at cervical os during office visit on sterile speculum exam.  The patient was admitted to the antepartum unit for MFM consult, ultrasound, prophylactic steroids per protocol, bedrest, IV fluids, and electronic fetal monitoring.  The patient was not experiencing leaking of fluid, vaginal bleeding, or uterine contractions.  Positive  fetal heart tones per EFM.  ADMISSION LABS:  CBC; WBC is 11.4, hemoglobin 12.6, hematocrit 37.6, and platelets 161.  The patient was admitted for expectant management and started on antibiotics treatment per protocol and magnesium sulfate for neuro protection.  On second hospital day, the patient's WBCs were 14.8, hemoglobin was 12.6, hematocrit 38.1, and platelets of 176.  Neutrophils were increased.  CBCs were repeated on March 13, 2011, March 21, 2011, March 22, 2011, March 25, 2011, and March 29, 2011, with stable CBC.  Max WBCs were on March 21, 2011, at 14.8.  The patient received 2 doses of betamethasone for fetal lung maturity.  The patient remained on continuous bedrest until hospital day 16 at which time the patient reported decreased fetal movement.  Fetal heart monitoring showed fetal tachycardia in the 160s and 170s.  The patient had experienced on hospital day 10 premature preterm rupture of membranes with clear fluid.  On hospital day 16, there was evidence of meconium- stained fluid, but no maternal evidence of infection.  The patient was afebrile and her CBC was stable.  The patient had undergone serial ultrasounds with MFM showing severe oligo in the last couple of days and on hospital day #16 biophysical profile was 2/8.  Decision was made to proceed to C-section for fetal indications as fetus was in breech position.  Magnesium sulfate was restarted per neuro protective protocol.  The patient underwent cesarean section on March 30, 2011, as well as a myomectomy for known fibroids.  Cesarean section was for breech presentation and biophysical profile of 2/8.  The patient delivered a female infant, weighed 580 g.  Apgars were 5, 6, and 7, at one, five, and ten minutes respectively.  Newborn was transferred to NICU.  Please see operative report for further details.  POSTOPERATIVE COURSE:  Postoperative course was uneventful. Postoperative labs; WBC 12.1, hemoglobin 11.0,  hematocrit 34.4, and platelets 150.  The patient's vital signs remained stable.  She was afebrile throughout her hospital stay.  Physical exam was within normal limits.  Wound was well approximated with staples.  The patient was started on breast pump as she desires to breast feed.    ______________________________ Arlan Organ, CNM   ______________________________ Maxie Better, M.D.    DP/MEDQ  D:  04/02/2011  T:  04/03/2011  Job:  [id number]  Electronically Signed by Arlan Organ CNM on 04/06/2011 01:28:09 AM Electronically Signed by Nena Jordan Deloy Archey M.D. on 04/19/2011 06:33:04 AM

## 2011-07-10 ENCOUNTER — Inpatient Hospital Stay (HOSPITAL_COMMUNITY): Admission: AD | Admit: 2011-07-10 | Payer: Self-pay | Source: Ambulatory Visit | Admitting: Obstetrics and Gynecology

## 2011-07-10 LAB — CBC
HCT: 40.1
Hemoglobin: 13.6
MCHC: 33.9
MCV: 85.2
Platelets: 226
RBC: 4.7
RDW: 13.5
WBC: 7.4

## 2011-07-10 LAB — ABO/RH: ABO/RH(D): O POS

## 2013-03-14 ENCOUNTER — Other Ambulatory Visit: Payer: Self-pay | Admitting: Family Medicine

## 2013-03-14 DIAGNOSIS — Z1231 Encounter for screening mammogram for malignant neoplasm of breast: Secondary | ICD-10-CM

## 2013-03-24 ENCOUNTER — Ambulatory Visit
Admission: RE | Admit: 2013-03-24 | Discharge: 2013-03-24 | Disposition: A | Discharge status: Home / Self Care | Payer: 59 | Source: Ambulatory Visit | Attending: Family Medicine | Admitting: Family Medicine

## 2013-03-24 DIAGNOSIS — Z1231 Encounter for screening mammogram for malignant neoplasm of breast: Secondary | ICD-10-CM

## 2013-07-15 ENCOUNTER — Ambulatory Visit (INDEPENDENT_AMBULATORY_CARE_PROVIDER_SITE_OTHER): Payer: Self-pay | Admitting: Internal Medicine

## 2013-07-15 ENCOUNTER — Encounter: Payer: Self-pay | Admitting: Internal Medicine

## 2013-07-15 DIAGNOSIS — A09 Infectious gastroenteritis and colitis, unspecified: Secondary | ICD-10-CM

## 2013-07-15 DIAGNOSIS — Z789 Other specified health status: Secondary | ICD-10-CM

## 2013-07-15 DIAGNOSIS — Z23 Encounter for immunization: Secondary | ICD-10-CM

## 2013-07-15 DIAGNOSIS — B379 Candidiasis, unspecified: Secondary | ICD-10-CM

## 2013-07-15 MED ORDER — FLUCONAZOLE 150 MG PO TABS: 150.0000 mg | ORAL_TABLET | Freq: Once | ORAL | Status: DC

## 2013-07-15 MED ORDER — CIPROFLOXACIN HCL 500 MG PO TABS: 500.0000 mg | ORAL_TABLET | Freq: Two times a day (BID) | ORAL | Status: DC

## 2013-07-18 NOTE — Progress Notes (Signed)
[facility] TRAVEL CLINIC NOTE  RFV: going to [place], and [place] Subjective:    Patient ID: Sherry [name], female    DOB: 1971/02/05, 42 y.o.   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 She has not traveled to [place] before nor international travel in the last 10 years  All:ASA  Meds: multivitamins, triamterene    Review of Systems     Objective:   Physical Exam        Assessment & Plan:  pretravel vaccinations = will give flu, hep a, typhoid, tdap and MMR  Traveler's diarrhea = will give rx for cipro and tips/precautions sheet  Vaginal candidiasis = gave rx for diflucan

## 2014-03-10 ENCOUNTER — Other Ambulatory Visit: Payer: Self-pay

## 2014-03-10 DIAGNOSIS — Z1231 Encounter for screening mammogram for malignant neoplasm of breast: Secondary | ICD-10-CM

## 2014-03-27 ENCOUNTER — Ambulatory Visit
Admission: RE | Admit: 2014-03-27 | Discharge: 2014-03-27 | Disposition: A | Discharge status: Home / Self Care | Payer: 59 | Source: Ambulatory Visit

## 2014-03-27 ENCOUNTER — Encounter (INDEPENDENT_AMBULATORY_CARE_PROVIDER_SITE_OTHER): Payer: Self-pay

## 2014-03-27 DIAGNOSIS — Z1231 Encounter for screening mammogram for malignant neoplasm of breast: Secondary | ICD-10-CM

## 2014-07-31 ENCOUNTER — Encounter: Payer: Self-pay | Admitting: Internal Medicine

## 2014-12-29 ENCOUNTER — Ambulatory Visit (INDEPENDENT_AMBULATORY_CARE_PROVIDER_SITE_OTHER): Payer: Self-pay | Admitting: Internal Medicine

## 2014-12-29 DIAGNOSIS — Z23 Encounter for immunization: Secondary | ICD-10-CM

## 2014-12-29 DIAGNOSIS — L309 Dermatitis, unspecified: Secondary | ICD-10-CM | POA: Insufficient documentation

## 2014-12-29 DIAGNOSIS — I10 Essential (primary) hypertension: Secondary | ICD-10-CM | POA: Insufficient documentation

## 2014-12-29 DIAGNOSIS — A09 Infectious gastroenteritis and colitis, unspecified: Secondary | ICD-10-CM

## 2014-12-29 MED ORDER — CIPROFLOXACIN HCL 500 MG PO TABS: 500.0000 mg | ORAL_TABLET | Freq: Two times a day (BID) | ORAL | Status: DC

## 2014-12-29 NOTE — Progress Notes (Signed)
Patient ID: Sherry [name], female   DOB: 1971/07/23, 44 y.o.   MRN: [id number] Subjective:   Sherry [name] is a 44 y.o. female who presents to the Infectious Disease clinic for travel consultation. Planned departure date: April 22nd   Planned return date: April 30th Countries of travel: [place], [place] in country[place]   [place] Accommodations: hotel Purpose of travel: work Prior travel out of [place]: yes, previously travelled to [place], [place] in Nov 2014 for work as well  Allergies  Allergen Reactions  . Aspirin    Pmhx: htn, eczema  Previous imms : typhoid in Oct 2014, hep A #1, MMR   Objective:   Medications: traimterene/HCTZ 37.5/25mg  daily    Assessment:    No contraindications to travel.     Plan:   Traveler's diarhea = will give rx for cipro. Gave precautions  imms = recommended to get hep A #2 today  Mosquito borne illnesses= recommended to use DEET based repellent, gave travel sheet

## 2015-03-05 ENCOUNTER — Other Ambulatory Visit: Payer: Self-pay

## 2015-03-05 DIAGNOSIS — Z1231 Encounter for screening mammogram for malignant neoplasm of breast: Secondary | ICD-10-CM

## 2015-03-29 ENCOUNTER — Ambulatory Visit: Payer: Self-pay

## 2015-03-30 ENCOUNTER — Ambulatory Visit: Payer: 59

## 2015-04-05 ENCOUNTER — Ambulatory Visit
Admission: RE | Admit: 2015-04-05 | Discharge: 2015-04-05 | Disposition: A | Discharge status: Home / Self Care | Payer: 59 | Source: Ambulatory Visit

## 2015-04-05 DIAGNOSIS — Z1231 Encounter for screening mammogram for malignant neoplasm of breast: Secondary | ICD-10-CM

## 2016-02-26 ENCOUNTER — Other Ambulatory Visit: Payer: Self-pay

## 2016-02-26 DIAGNOSIS — Z1231 Encounter for screening mammogram for malignant neoplasm of breast: Secondary | ICD-10-CM

## 2016-04-07 ENCOUNTER — Other Ambulatory Visit: Payer: Self-pay | Admitting: Family Medicine

## 2016-04-07 ENCOUNTER — Ambulatory Visit
Admission: RE | Admit: 2016-04-07 | Discharge: 2016-04-07 | Disposition: A | Discharge status: Home / Self Care | Payer: 59 | Source: Ambulatory Visit

## 2016-04-07 DIAGNOSIS — Z1231 Encounter for screening mammogram for malignant neoplasm of breast: Secondary | ICD-10-CM

## 2018-08-19 ENCOUNTER — Other Ambulatory Visit: Payer: Self-pay | Admitting: Orthopedic Surgery

## 2018-08-19 DIAGNOSIS — M5412 Radiculopathy, cervical region: Secondary | ICD-10-CM

## 2018-08-29 ENCOUNTER — Ambulatory Visit
Admission: RE | Admit: 2018-08-29 | Discharge: 2018-08-29 | Disposition: A | Discharge status: Home / Self Care | Payer: 59 | Source: Ambulatory Visit | Attending: Orthopedic Surgery | Admitting: Orthopedic Surgery

## 2018-08-29 DIAGNOSIS — M5412 Radiculopathy, cervical region: Secondary | ICD-10-CM

## 2018-09-15 ENCOUNTER — Encounter (INDEPENDENT_AMBULATORY_CARE_PROVIDER_SITE_OTHER): Payer: Self-pay

## 2018-10-07 ENCOUNTER — Ambulatory Visit (INDEPENDENT_AMBULATORY_CARE_PROVIDER_SITE_OTHER): Payer: Self-pay | Admitting: Family Medicine

## 2018-10-21 ENCOUNTER — Ambulatory Visit (INDEPENDENT_AMBULATORY_CARE_PROVIDER_SITE_OTHER): Payer: Self-pay | Admitting: Family Medicine

## 2019-09-26 DIAGNOSIS — Z20828 Contact with and (suspected) exposure to other viral communicable diseases: Secondary | ICD-10-CM | POA: Diagnosis not present

## 2019-10-27 DIAGNOSIS — M65311 Trigger thumb, right thumb: Secondary | ICD-10-CM | POA: Diagnosis not present

## 2019-11-14 DIAGNOSIS — L2082 Flexural eczema: Secondary | ICD-10-CM | POA: Diagnosis not present

## 2019-11-14 DIAGNOSIS — I10 Essential (primary) hypertension: Secondary | ICD-10-CM | POA: Diagnosis not present

## 2019-11-14 DIAGNOSIS — R739 Hyperglycemia, unspecified: Secondary | ICD-10-CM | POA: Diagnosis not present

## 2019-12-06 DIAGNOSIS — M65311 Trigger thumb, right thumb: Secondary | ICD-10-CM | POA: Diagnosis not present

## 2019-12-06 DIAGNOSIS — M25521 Pain in right elbow: Secondary | ICD-10-CM | POA: Diagnosis not present

## 2020-02-09 DIAGNOSIS — M65311 Trigger thumb, right thumb: Secondary | ICD-10-CM | POA: Diagnosis not present

## 2020-02-17 DIAGNOSIS — M65311 Trigger thumb, right thumb: Secondary | ICD-10-CM | POA: Diagnosis not present

## 2020-04-16 DIAGNOSIS — L2082 Flexural eczema: Secondary | ICD-10-CM | POA: Diagnosis not present

## 2020-04-16 DIAGNOSIS — R739 Hyperglycemia, unspecified: Secondary | ICD-10-CM | POA: Diagnosis not present

## 2020-04-16 DIAGNOSIS — I10 Essential (primary) hypertension: Secondary | ICD-10-CM | POA: Diagnosis not present

## 2020-08-01 DIAGNOSIS — Z1231 Encounter for screening mammogram for malignant neoplasm of breast: Secondary | ICD-10-CM | POA: Diagnosis not present

## 2020-08-01 DIAGNOSIS — Z01419 Encounter for gynecological examination (general) (routine) without abnormal findings: Secondary | ICD-10-CM | POA: Diagnosis not present

## 2020-08-01 DIAGNOSIS — Z124 Encounter for screening for malignant neoplasm of cervix: Secondary | ICD-10-CM | POA: Diagnosis not present

## 2020-08-02 DIAGNOSIS — N898 Other specified noninflammatory disorders of vagina: Secondary | ICD-10-CM | POA: Diagnosis not present

## 2020-08-02 IMAGING — MR MR CERVICAL SPINE W/O CM
4 of 5 series · 29 of 48 positions shown · non-contrast
Comparison: Prior radiographs from 06/28/2018

CLINICAL DATA: Initial evaluation for neck pain radiating into the
bilateral arms with associated numbness, worse than right.

EXAM:
MRI CERVICAL SPINE WITHOUT CONTRAST
TECHNIQUE: Multiplanar, multisequence MR imaging of the cervical spine was
performed. No intravenous contrast was administered.

[Series 5: T1 · sagittal · 3.0mm · 0.66mm/px · 8 of 15 slices shown]
[im 1/15]
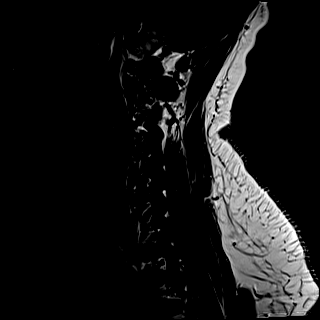
[im 3/15]
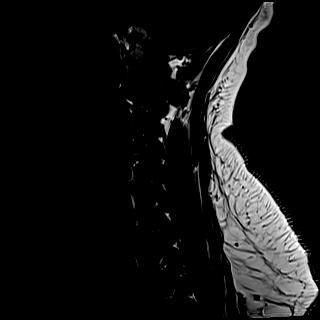
[im 5/15]
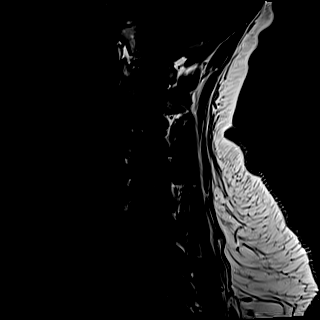
[im 7/15]
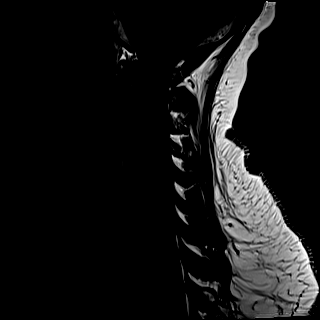
[im 9/15]
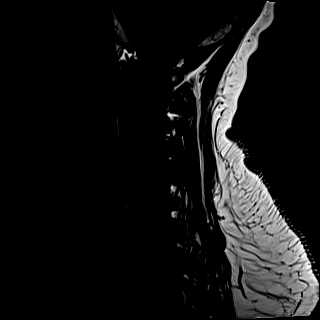
[im 11/15]
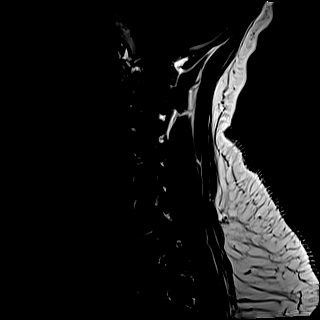
[im 13/15]
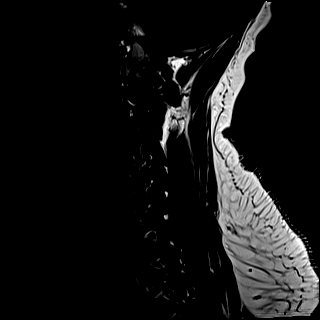
[im 15/15]
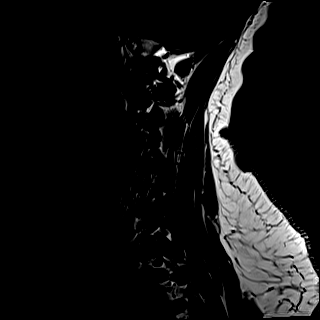

[Series 6: T2 · sagittal · 3.0mm · 0.55mm/px · 7 of 15 slices shown (1 of 2)]
[im 1/15]
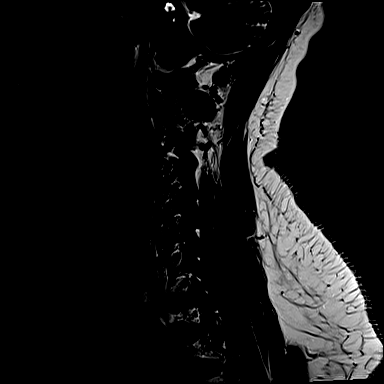
[im 3/15]
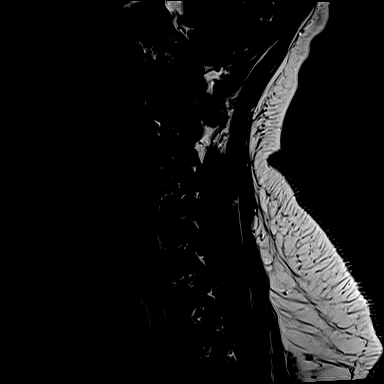
[im 5/15]
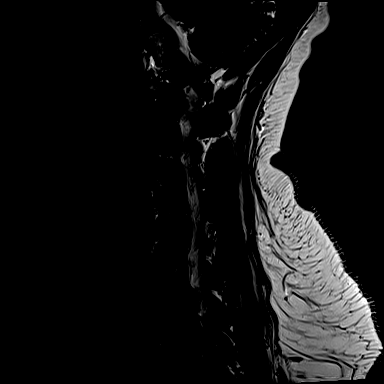
[im 8/15]
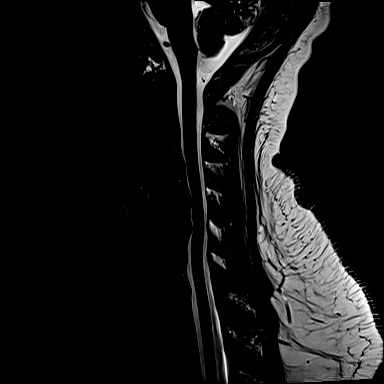
[im 10/15]
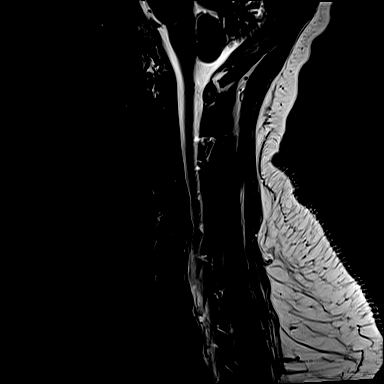
[im 12/15]
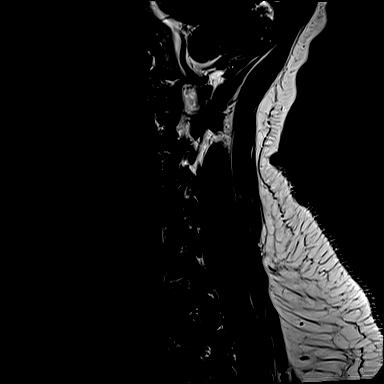
[im 15/15]
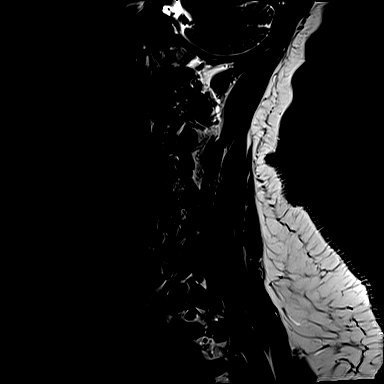

[Series 7: STIR · sagittal · 3.0mm · 0.33mm/px · 5 of 15 slices shown]
[im 1/15]
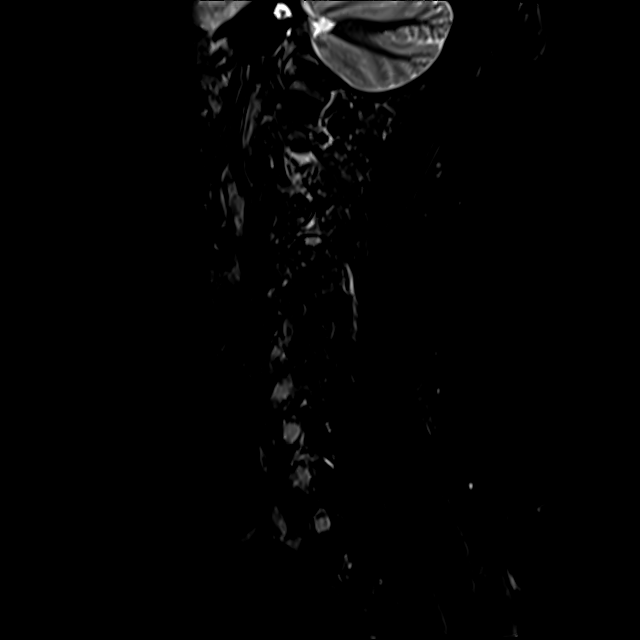
[im 3/15]
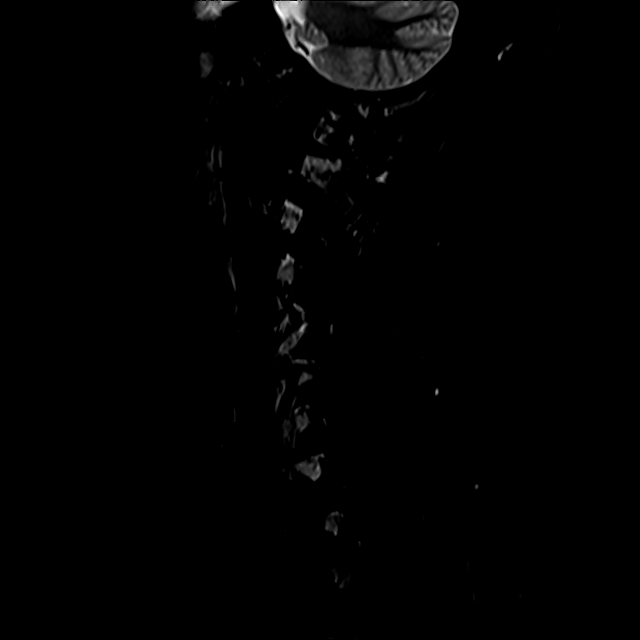
[im 5/15]
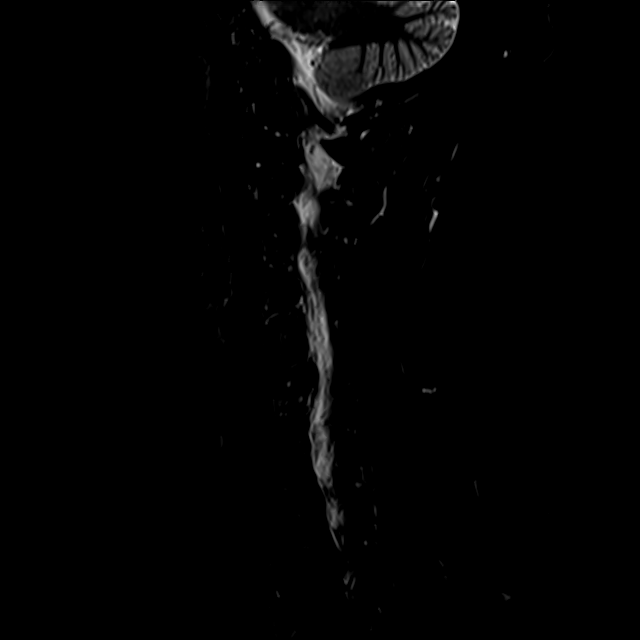
[im 8/15]
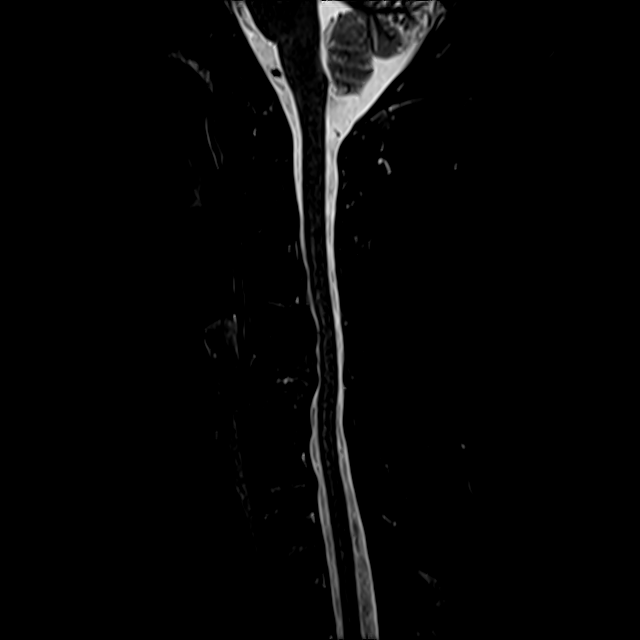
[im 12/15]
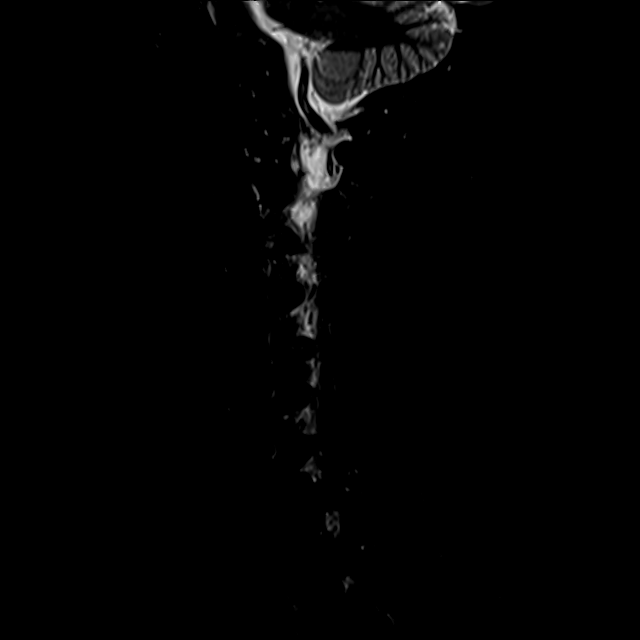

[Series 8: T2 · axial · 3.0mm · 0.62mm/px · z∈[-27,+77]mm · 9 of 28 slices shown (2 of 2)]
[im 1/28]
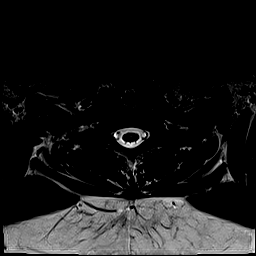
[im 5/28]
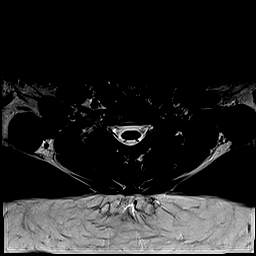
[im 10/28]
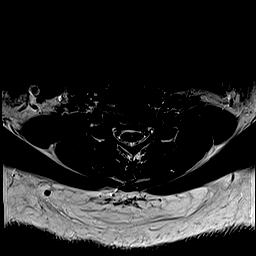
[im 12/28]
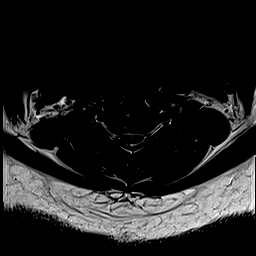
[im 14/28]
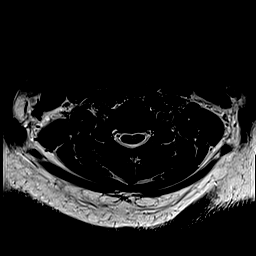
[im 16/28]
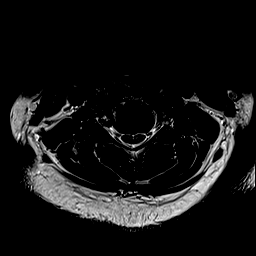
[im 19/28]
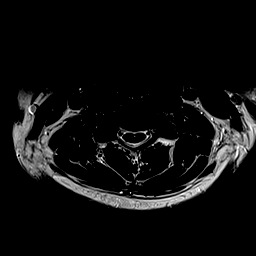
[im 23/28]
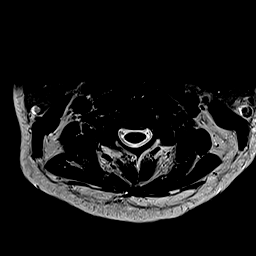
[im 28/28]
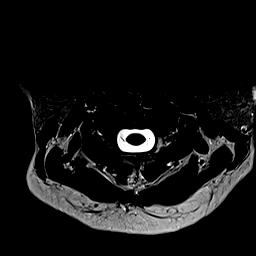

[29 of 48 positions shown; findings below may reference images not displayed]

FINDINGS: Alignment: Straightening of the normal cervical lordosis. Trace 2 mm
retrolisthesis of C5 on C6, likely chronic and degenerative.

Vertebrae: Vertebral body heights maintained without evidence for
acute or chronic fracture. Underlying bone marrow signal intensity
mildly decreased on T1 weighted imaging, most commonly related to
anemia, smoking, or obesity. No discrete or worrisome osseous
lesions. Reactive endplate changes present about the C5-6
interspace.

Cord: Signal intensity within the cervical spinal cord is normal.

Posterior Fossa, vertebral arteries, paraspinal tissues: Visualized
brain and posterior fossa within normal limits. Craniocervical
junction normal. Paraspinous and prevertebral soft tissues within
normal limits. Normal intravascular flow voids seen within the
vertebral arteries bilaterally.

Disc levels:

C2-C3: Unremarkable.

C3-C4: Broad central disc protrusion indents the ventral thecal sac.
Mild spinal stenosis without cord deformity. Foramina remain patent.

C4-C5: Broad posterior disc osteophyte flattens and partially
effaces the ventral thecal sac. Mild spinal stenosis without
significant cord deformity. Foramina remain patent.

C5-C6: Trace retrolisthesis. Chronic intervertebral disc space
narrowing with diffuse disc bulge and reactive endplate changes.
Right greater than left uncovertebral hypertrophy. Flattening of the
ventral thecal sac with resultant mild spinal stenosis. Moderate
bilateral C6 foraminal narrowing, right worse than left.

C6-C7: Mild disc bulge with uncovertebral hypertrophy. No
significant spinal stenosis. Mild right C7 foraminal narrowing.

C7-T1:  Mild facet hypertrophy.  No stenosis.

Visualized upper thoracic spine demonstrates no significant finding.
IMPRESSION: 1. Degenerative disc osteophyte at C5-6 with resultant mild canal
with moderate right greater than left C6 foraminal narrowing.
2. Degenerative disc bulging at C3-4 and C4-5 with resultant mild
spinal stenosis without significant foraminal encroachment.
3. Mild disc bulge with uncovertebral hypertrophy at C6-7 with
resultant mild right C7 foraminal narrowing.

## 2020-10-17 DIAGNOSIS — Z20822 Contact with and (suspected) exposure to covid-19: Secondary | ICD-10-CM | POA: Diagnosis not present

## 2023-03-22 ENCOUNTER — Other Ambulatory Visit: Payer: Self-pay

## 2023-03-22 ENCOUNTER — Emergency Department (HOSPITAL_COMMUNITY): Payer: Managed Care, Other (non HMO)

## 2023-03-22 ENCOUNTER — Emergency Department (HOSPITAL_COMMUNITY)
Admission: EM | Admit: 2023-03-22 | Discharge: 2023-03-22 | Disposition: A | Discharge status: Home / Self Care | Payer: Managed Care, Other (non HMO) | Attending: Emergency Medicine | Admitting: Emergency Medicine

## 2023-03-22 ENCOUNTER — Encounter (HOSPITAL_COMMUNITY): Payer: Self-pay | Admitting: Emergency Medicine

## 2023-03-22 DIAGNOSIS — I10 Essential (primary) hypertension: Secondary | ICD-10-CM | POA: Diagnosis not present

## 2023-03-22 DIAGNOSIS — S335XXA Sprain of ligaments of lumbar spine, initial encounter: Secondary | ICD-10-CM | POA: Diagnosis not present

## 2023-03-22 DIAGNOSIS — W010XXA Fall on same level from slipping, tripping and stumbling without subsequent striking against object, initial encounter: Secondary | ICD-10-CM | POA: Diagnosis not present

## 2023-03-22 DIAGNOSIS — M545 Low back pain, unspecified: Secondary | ICD-10-CM | POA: Diagnosis present

## 2023-03-22 DIAGNOSIS — M5126 Other intervertebral disc displacement, lumbar region: Secondary | ICD-10-CM

## 2023-03-22 MED ORDER — HYDROMORPHONE HCL 1 MG/ML IJ SOLN
0.5000 mg | Freq: Once | INTRAMUSCULAR | Status: AC
  Administered 2023-03-22: 0.5 mg via SUBCUTANEOUS
  Filled 2023-03-22: qty 1

## 2023-03-22 MED ORDER — KETOROLAC TROMETHAMINE 15 MG/ML IJ SOLN
15.0000 mg | Freq: Once | INTRAMUSCULAR | Status: AC
  Administered 2023-03-22: 15 mg via INTRAMUSCULAR
  Filled 2023-03-22: qty 1

## 2023-03-22 MED ORDER — OXYCODONE-ACETAMINOPHEN 5-325 MG PO TABS
1.0000 | ORAL_TABLET | Freq: Once | ORAL | Status: AC
  Administered 2023-03-22: 1 via ORAL
  Filled 2023-03-22: qty 1

## 2023-03-22 MED ORDER — KETOROLAC TROMETHAMINE 15 MG/ML IJ SOLN
15.0000 mg | Freq: Once | INTRAMUSCULAR | Status: AC
  Administered 2023-03-22: 15 mg via INTRAVENOUS
  Filled 2023-03-22: qty 1

## 2023-03-22 MED ORDER — IBUPROFEN 600 MG PO TABS: 600.0000 mg | ORAL_TABLET | Freq: Four times a day (QID) | ORAL | 0 refills | Status: DC | PRN

## 2023-03-22 MED ORDER — OXYCODONE HCL 5 MG PO TABS: 5.0000 mg | ORAL_TABLET | Freq: Four times a day (QID) | ORAL | 0 refills | Status: DC | PRN

## 2023-03-22 NOTE — Discharge Instructions (Addendum)
We evaluated you in the emergency department for your back and hip pain.  We obtained x-rays and CT scans of your low back and hip.  We did not see any signs of a fracture or broken bone.  Your CT scan did show that you may have a herniated disc in your low back which can cause nerve compression and tingling in your foot.  This can be painful but is not dangerous.  I would recommend following up with a spinal surgeon.  Please follow-up with Dr. Conchita Paris.  They may want you to have physical therapy or other testing.  Please resume your normal activities as soon as you are able.  I have attached some back exercises that you can try to help with your symptoms at home.  Please take Tylenol and Motrin for your symptoms at home.  You can take 1000 mg of Tylenol every 6 hours and 600 mg of ibuprofen every 6 hours as needed for your symptoms.  You can take these medicines together as needed, either at the same time, or alternating every 3 hours.  I have also prescribed you some oxycodone in case your symptoms are unrelieved by these medicines.  Please only take this if necessary as it may make you drowsy, do not mix with alcohol or drive while taking it.  You can also use heat or ice therapy to help with pain and inflammation.  If you develop any new or worsening symptoms such as numbness or tingling in your groin, incontinence or loss of control of your stool or bladder, trouble moving your legs, fevers or chills, worsening pain, inability to walk, or any other concerning symptoms, please return to the emergency department for reassessment.

## 2023-03-22 NOTE — ED Triage Notes (Signed)
Pt slipped and fell in her laundry room. She was unable to get up and feels pain from hip down the right leg. She feels a tingle in her right foot.  BP 157/90 HR 88  O2 98

## 2023-03-22 NOTE — ED Notes (Signed)
Attempted to ambulate patient. She was able to stand with the help of two people and she was able to take about 3 steps with both feet with help of two people. She stated that it was painful and the right foot felt heavy to lift. She is also having a tingling sensation in the right foot.

## 2023-03-22 NOTE — ED Provider Notes (Signed)
[facility]MENT AT [facility] Provider Note  CSN: [id number] Arrival date & time: 03/22/23 1508  Chief Complaint(s) Fall  HPI Sherry [name] is a 52 y.o. female history of hypertension presenting to the emergency department with fall.  Patient reports that she slipped, landing on her right side.  Reports pain primarily in the right hip, right-sided low back.  Denies midline pain.  Reported that she had a tiny bit of numbness in her toes which is gone.  Had to be helped up by the paramedics.  Denies head injury, back pain, chest or abdominal pain, weakness.  Denies similar episode previous.   Past Medical History History reviewed. No pertinent past medical history. Patient Active Problem List   Diagnosis Date Noted   Essential hypertension, benign 12/29/2014   Eczema 12/29/2014   Home Medication(s) Prior to Admission medications   Medication Sig Start Date End Date Taking? Authorizing Provider  ibuprofen (ADVIL) 600 MG tablet Take 1 tablet (600 mg total) by mouth every 6 (six) hours as needed. 03/22/23  Yes Lonell Grandchild, MD  oxyCODONE (ROXICODONE) 5 MG immediate release tablet Take 1 tablet (5 mg total) by mouth every 6 (six) hours as needed for severe pain. 03/22/23  Yes Lonell Grandchild, MD  ciprofloxacin (CIPRO) 500 MG tablet Take 1 tablet (500 mg total) by mouth 2 (two) times daily. As needed for traveler's diarrhea. 12/29/14   Judyann Munson, MD  fluconazole (DIFLUCAN) 150 MG tablet Take 1 tablet (150 mg total) by mouth once. 07/15/13   Judyann Munson, MD                                                                                                                                    Past Surgical History History reviewed. No pertinent surgical history. Family History No family history on file.  Social History   Allergies Aspirin  Review of Systems Review of Systems  All other systems reviewed and are negative.   Physical Exam Vital  Signs  I have reviewed the triage vital signs BP 128/82 (BP Location: Left Arm)   Pulse 78   Temp 98 F (36.7 C) (Oral)   Resp 18   Ht 5\' 10"  (1.778 m)   SpO2 100%  Physical Exam Vitals and nursing note reviewed.  Constitutional:      General: She is not in acute distress.    Appearance: She is well-developed.  HENT:     Head: Normocephalic and atraumatic.     Mouth/Throat:     Mouth: Mucous membranes are moist.  Eyes:     Pupils: Pupils are equal, round, and reactive to light.  Cardiovascular:     Rate and Rhythm: Normal rate and regular rhythm.     Pulses:          Dorsalis pedis pulses are 2+ on the right side and 2+ on the left side.  Heart sounds: No murmur heard. Pulmonary:     Effort: Pulmonary effort is normal. No respiratory distress.     Breath sounds: Normal breath sounds.  Abdominal:     General: Abdomen is flat.     Palpations: Abdomen is soft.     Tenderness: There is no abdominal tenderness.  Musculoskeletal:        General: No tenderness.     Right lower leg: No edema.     Left lower leg: No edema.     Comments: No midline cervical or thoracic spine tenderness, right greater than midline lumbar tenderness without focal midline tenderness to palpation.  Painful range of motion of the right hip with tenderness over the right hip, no tenderness over the right knee, ankle or foot.  Full active range of motion of bilateral upper and left lower extremities with no focal tenderness or deformity.  Skin:    General: Skin is warm and dry.  Neurological:     General: No focal deficit present.     Mental Status: She is alert. Mental status is at baseline.     Comments: No weakness in the bilateral lower extremities or appreciable neurologic deficits such as sensory deficit bilateral lower extremities.  Psychiatric:        Mood and Affect: Mood normal.        Behavior: Behavior normal.     ED Results and Treatments Labs (all labs ordered are listed, but only  abnormal results are displayed) Labs Reviewed - No data to display                                                                                                                        Radiology CT Lumbar Spine Wo Contrast  Result Date: 03/22/2023 CLINICAL DATA:  Back trauma, no prior imaging (Age >= 16y) EXAM: CT LUMBAR SPINE WITHOUT CONTRAST TECHNIQUE: Multidetector CT imaging of the lumbar spine was performed without intravenous contrast administration. Multiplanar CT image reconstructions were also generated. RADIATION DOSE REDUCTION: This exam was performed according to the departmental dose-optimization program which includes automated exposure control, adjustment of the mA and/or kV according to patient size and/or use of iterative reconstruction technique. COMPARISON:  None Available. FINDINGS: Segmentation: 5 lumbar type vertebrae. Alignment: Normal. Vertebrae: No acute fracture or focal pathologic process. Moderate disc space loss at L5-S1 Paraspinal and other soft tissues: Negative. Disc levels: No evidence of high-grade spinal canal stenosis. Likely moderate narrowing of the right lateral recess at L5-S1 secondary to a right paracentral disc protrusion IMPRESSION: 1. No acute fracture or traumatic listhesis. 2. Likely at least moderate narrowing of the right lateral recess at L5-S1 secondary to a right paracentral disc protrusion. Electronically Signed   By: Lorenza Cambridge M.D.   On: 03/22/2023 17:43   CT PELVIS WO CONTRAST  Result Date: 03/22/2023 CLINICAL DATA:  Slipped and fell, right hip pain EXAM: CT PELVIS WITHOUT CONTRAST TECHNIQUE: Multidetector CT imaging of the pelvis was performed following the standard  protocol without intravenous contrast. RADIATION DOSE REDUCTION: This exam was performed according to the departmental dose-optimization program which includes automated exposure control, adjustment of the mA and/or kV according to patient size and/or use of iterative reconstruction  technique. COMPARISON:  03/22/2023 FINDINGS: Urinary Tract:  Distal ureters and bladder are unremarkable. Bowel: No bowel obstruction or ileus. Normal appendix right lower quadrant. No bowel wall thickening or inflammatory change. Vascular/Lymphatic: No pathologically enlarged lymph nodes. No significant vascular abnormality seen. Reproductive: Uterus and adnexal structures are grossly unremarkable. Other: No free fluid or free intraperitoneal gas. Small fat containing midline lower anterior abdominal wall ventral hernia. No bowel herniation. Musculoskeletal: No acute displaced fracture. Mild symmetrical bilateral hip osteoarthritis. Severe spondylosis at the L5-S1 level with at least mild central canal stenosis. Reconstructed images demonstrate no additional findings. IMPRESSION: 1. No acute displaced fracture. 2. Severe L5-S1 spondylosis. 3. No acute intrapelvic process. Electronically Signed   By: Sharlet Salina M.D.   On: 03/22/2023 17:23   DG Hip Unilat With Pelvis 2-3 Views Right  Result Date: 03/22/2023 CLINICAL DATA:  Right hip pain after fall. EXAM: DG HIP (WITH OR WITHOUT PELVIS) 2-3V RIGHT COMPARISON:  None Available. FINDINGS: There is no evidence of hip fracture or dislocation. Mild osteophyte formation of right hip is noted without significant narrowing. IMPRESSION: Mild degenerative joint disease of right hip. No acute abnormality seen. Electronically Signed   By: Lupita Raider M.D.   On: 03/22/2023 16:11   DG Lumbar Spine Complete  Result Date: 03/22/2023 CLINICAL DATA:  Lower back pain after fall. EXAM: LUMBAR SPINE - COMPLETE 4+ VIEW COMPARISON:  None Available. FINDINGS: There is no evidence of lumbar spine fracture. Alignment is normal. There appears to be fusion of the L5-S1 disc space secondary to degenerative change IMPRESSION: Probable fusion of L5-S1 disc space secondary to degenerative change. No acute abnormality seen. Electronically Signed   By: Lupita Raider M.D.   On:  03/22/2023 16:10    Pertinent labs & imaging results that were available during my care of the patient were reviewed by me and considered in my medical decision making (see MDM for details).  Medications Ordered in ED Medications  ketorolac (TORADOL) 15 MG/ML injection 15 mg (15 mg Intravenous Given 03/22/23 1537)  oxyCODONE-acetaminophen (PERCOCET/ROXICET) 5-325 MG per tablet 1 tablet (1 tablet Oral Given 03/22/23 1537)  ketorolac (TORADOL) 15 MG/ML injection 15 mg (15 mg Intramuscular Given 03/22/23 1741)  HYDROmorphone (DILAUDID) injection 0.5 mg (0.5 mg Subcutaneous Given 03/22/23 1741)                                                                                                                                     Procedures Procedures  (including critical care time)  Medical Decision Making / ED Course   MDM:  52 year old female presenting to the emergency department with right hip pain, right-sided low back pain after fall.  Differential includes  hematoma or soft tissue injury, hip fracture, sprain.  Less likely spinal fracture with right greater than midline tenderness but will check lumbar x-ray.  Will obtain x-ray of the right hip.  No evidence of other traumatic injury.  Patient denies head strike and her examination is otherwise reassuring.  She reported some tingling initially but on exam her neurosensory evaluation is normal. Will re-assess Clinical Course as of 03/22/23 Sherry [name] Mar 22, 2023  1653 Patient trialed ambulation.  Was able to take a few steps but had significant pain.  Nurse reported some tingling in the foot.  Reassessed, patient denies this to me.  She has normal strength and sensation in the bilateral lower extremities still.  Will obtain further imaging. [WS]  1814 Patient has right lateral disc protrusion on CT, may explain some of her symptoms.  CT also with no evidence of hip fracture.  Patient does feel better.  She has already been weightbearing, extreme  low concern for fracture not visualized by x-ray or CT scan.  No evidence of cauda equina syndrome or spinal cord compression on physical exam.  Will discharge patient, recommended follow-up with spinal surgeon.  Discussed home pain control. Will discharge patient to home. All questions answered. Patient comfortable with plan of discharge. Return precautions discussed with patient and specified on the after visit summary.  [WS]    Clinical Course User Index [WS] Lonell Grandchild, MD     Additional history obtained: -Additional history obtained from ems and spouse -External records from outside source obtained and reviewed including: Chart review including previous notes, labs, imaging, consultation notes including prior outpatient visit for back pain   Imaging Studies ordered: I ordered imaging studies including XR and CT hip/pelvis/lumbar On my interpretation imaging demonstrates no acute fracture I independently visualized and interpreted imaging. I agree with the radiologist interpretation   Medicines ordered and prescription drug management: Meds ordered this encounter  Medications   ketorolac (TORADOL) 15 MG/ML injection 15 mg   oxyCODONE-acetaminophen (PERCOCET/ROXICET) 5-325 MG per tablet 1 tablet   ketorolac (TORADOL) 15 MG/ML injection 15 mg   HYDROmorphone (DILAUDID) injection 0.5 mg   ibuprofen (ADVIL) 600 MG tablet    Sig: Take 1 tablet (600 mg total) by mouth every 6 (six) hours as needed.    Dispense:  30 tablet    Refill:  0   oxyCODONE (ROXICODONE) 5 MG immediate release tablet    Sig: Take 1 tablet (5 mg total) by mouth every 6 (six) hours as needed for severe pain.    Dispense:  10 tablet    Refill:  0    -I have reviewed the patients home medicines and have made adjustments as needed   Social Determinants of Health:  Diagnosis or treatment significantly limited by social determinants of health: obesity   Reevaluation: After the interventions noted  above, I reevaluated the patient and found that their symptoms have improved  Co morbidities that complicate the patient evaluation History reviewed. No pertinent past medical history.    Dispostion: Disposition decision including need for hospitalization was considered, and patient discharged from emergency department.    Final Clinical Impression(s) / ED Diagnoses Final diagnoses:  Lumbar sprain, initial encounter  Lumbar disc herniation     This chart was dictated using voice recognition software.  Despite best efforts to proofread,  errors can occur which can change the documentation meaning.    Lonell Grandchild, MD 03/22/23 Rickey Primus

## 2023-09-15 ENCOUNTER — Other Ambulatory Visit: Payer: Self-pay | Admitting: Orthopedic Surgery

## 2023-09-15 DIAGNOSIS — M545 Low back pain, unspecified: Secondary | ICD-10-CM

## 2023-10-09 ENCOUNTER — Ambulatory Visit
Admission: RE | Admit: 2023-10-09 | Discharge: 2023-10-09 | Disposition: A | Discharge status: Home / Self Care | Payer: Managed Care, Other (non HMO) | Source: Ambulatory Visit | Attending: Orthopedic Surgery | Admitting: Orthopedic Surgery

## 2023-10-09 DIAGNOSIS — M545 Low back pain, unspecified: Secondary | ICD-10-CM

## 2023-10-09 MED ORDER — METHYLPREDNISOLONE ACETATE 80 MG/ML IJ SUSP: 80.0000 mg | Freq: Once | INTRAMUSCULAR | Status: DC

## 2023-12-02 ENCOUNTER — Other Ambulatory Visit: Payer: Self-pay | Admitting: Orthopedic Surgery

## 2023-12-02 DIAGNOSIS — M533 Sacrococcygeal disorders, not elsewhere classified: Secondary | ICD-10-CM

## 2023-12-08 ENCOUNTER — Other Ambulatory Visit: Payer: Self-pay | Admitting: Orthopedic Surgery

## 2023-12-08 DIAGNOSIS — M545 Low back pain, unspecified: Secondary | ICD-10-CM

## 2023-12-16 ENCOUNTER — Other Ambulatory Visit

## 2023-12-18 ENCOUNTER — Ambulatory Visit
Admission: RE | Admit: 2023-12-18 | Discharge: 2023-12-18 | Disposition: A | Discharge status: Home / Self Care | Source: Ambulatory Visit | Attending: Orthopedic Surgery | Admitting: Orthopedic Surgery

## 2023-12-18 DIAGNOSIS — M545 Low back pain, unspecified: Secondary | ICD-10-CM

## 2024-01-28 ENCOUNTER — Other Ambulatory Visit: Payer: Self-pay | Admitting: Orthopedic Surgery

## 2024-01-28 DIAGNOSIS — M533 Sacrococcygeal disorders, not elsewhere classified: Secondary | ICD-10-CM

## 2024-01-28 DIAGNOSIS — M545 Low back pain, unspecified: Secondary | ICD-10-CM

## 2024-03-30 ENCOUNTER — Other Ambulatory Visit

## 2024-03-30 ENCOUNTER — Inpatient Hospital Stay: Admission: RE | Admit: 2024-03-30 | Source: Ambulatory Visit

## 2024-07-25 ENCOUNTER — Emergency Department

## 2024-07-25 ENCOUNTER — Encounter: Payer: Self-pay | Admitting: Emergency Medicine

## 2024-07-25 ENCOUNTER — Inpatient Hospital Stay
Admission: EM | Admit: 2024-07-25 | Discharge: 2024-07-27 | DRG: 683 | Disposition: A | Discharge status: Home / Self Care | Attending: Internal Medicine | Admitting: Internal Medicine

## 2024-07-25 ENCOUNTER — Other Ambulatory Visit: Payer: Self-pay

## 2024-07-25 DIAGNOSIS — K529 Noninfective gastroenteritis and colitis, unspecified: Secondary | ICD-10-CM | POA: Diagnosis present

## 2024-07-25 DIAGNOSIS — Z6838 Body mass index (BMI) 38.0-38.9, adult: Secondary | ICD-10-CM

## 2024-07-25 DIAGNOSIS — R55 Syncope and collapse: Secondary | ICD-10-CM

## 2024-07-25 DIAGNOSIS — I10 Essential (primary) hypertension: Secondary | ICD-10-CM | POA: Diagnosis present

## 2024-07-25 DIAGNOSIS — R42 Dizziness and giddiness: Principal | ICD-10-CM

## 2024-07-25 DIAGNOSIS — E1169 Type 2 diabetes mellitus with other specified complication: Secondary | ICD-10-CM | POA: Diagnosis not present

## 2024-07-25 DIAGNOSIS — E860 Dehydration: Secondary | ICD-10-CM | POA: Diagnosis present

## 2024-07-25 DIAGNOSIS — N179 Acute kidney failure, unspecified: Secondary | ICD-10-CM | POA: Diagnosis not present

## 2024-07-25 DIAGNOSIS — E66812 Obesity, class 2: Secondary | ICD-10-CM | POA: Diagnosis present

## 2024-07-25 DIAGNOSIS — E861 Hypovolemia: Secondary | ICD-10-CM | POA: Diagnosis present

## 2024-07-25 DIAGNOSIS — E872 Acidosis, unspecified: Secondary | ICD-10-CM | POA: Diagnosis present

## 2024-07-25 DIAGNOSIS — E119 Type 2 diabetes mellitus without complications: Secondary | ICD-10-CM | POA: Diagnosis present

## 2024-07-25 DIAGNOSIS — I951 Orthostatic hypotension: Secondary | ICD-10-CM | POA: Diagnosis present

## 2024-07-25 DIAGNOSIS — Z7984 Long term (current) use of oral hypoglycemic drugs: Secondary | ICD-10-CM

## 2024-07-25 DIAGNOSIS — Z7985 Long-term (current) use of injectable non-insulin antidiabetic drugs: Secondary | ICD-10-CM | POA: Diagnosis not present

## 2024-07-25 HISTORY — DX: Essential (primary) hypertension: I10

## 2024-07-25 HISTORY — DX: Type 2 diabetes mellitus without complications: E11.9

## 2024-07-25 LAB — COMPREHENSIVE METABOLIC PANEL WITH GFR
ALT: 15 U/L (ref 0–44)
AST: 18 U/L (ref 15–41)
Albumin: 3.8 g/dL (ref 3.5–5.0)
Alkaline Phosphatase: 62 U/L (ref 38–126)
Anion gap: 10 (ref 5–15)
BUN: 34 mg/dL — ABNORMAL HIGH (ref 6–20)
CO2: 17 mmol/L — ABNORMAL LOW (ref 22–32)
Calcium: 8.7 mg/dL — ABNORMAL LOW (ref 8.9–10.3)
Chloride: 111 mmol/L (ref 98–111)
Creatinine, Ser: 2.2 mg/dL — ABNORMAL HIGH (ref 0.44–1.00)
GFR, Estimated: 26 mL/min — ABNORMAL LOW (ref >60)
Glucose, Bld: 111 mg/dL — ABNORMAL HIGH (ref 70–99)
Potassium: 4.2 mmol/L (ref 3.5–5.1)
Sodium: 138 mmol/L (ref 135–145)
Total Bilirubin: 0.9 mg/dL (ref 0.0–1.2)
Total Protein: 7.4 g/dL (ref 6.5–8.1)

## 2024-07-25 LAB — URINALYSIS, ROUTINE W REFLEX MICROSCOPIC
Bilirubin Urine: NEGATIVE
Glucose, UA: NEGATIVE mg/dL
Ketones, ur: NEGATIVE mg/dL
Leukocytes,Ua: NEGATIVE
Nitrite: NEGATIVE
Protein, ur: 100 mg/dL — AB
RBC / HPF: >50 RBC/hpf (ref 0–5)
Specific Gravity, Urine: 1.015 (ref 1.005–1.030)
pH: 5 (ref 5.0–8.0)

## 2024-07-25 LAB — TROPONIN I (HIGH SENSITIVITY)
Troponin I (High Sensitivity): <2 ng/L (ref <18)
Troponin I (High Sensitivity): 2 ng/L (ref <18)

## 2024-07-25 LAB — CBC
HCT: 42.7 % (ref 36.0–46.0)
Hemoglobin: 13.2 g/dL (ref 12.0–15.0)
MCH: 28.1 pg (ref 26.0–34.0)
MCHC: 30.9 g/dL (ref 30.0–36.0)
MCV: 90.9 fL (ref 80.0–100.0)
Platelets: 203 K/uL (ref 150–400)
RBC: 4.7 MIL/uL (ref 3.87–5.11)
RDW: 13.4 % (ref 11.5–15.5)
WBC: 10.1 K/uL (ref 4.0–10.5)
nRBC: 0 % (ref 0.0–0.2)

## 2024-07-25 LAB — LIPID PANEL
Cholesterol: 151 mg/dL (ref 0–200)
HDL: 32 mg/dL — ABNORMAL LOW (ref >40)
LDL Cholesterol: 95 mg/dL (ref 0–99)
Total CHOL/HDL Ratio: 4.7 ratio
Triglycerides: 122 mg/dL (ref <150)
VLDL: 24 mg/dL (ref 0–40)

## 2024-07-25 LAB — MAGNESIUM: Magnesium: 1.9 mg/dL (ref 1.7–2.4)

## 2024-07-25 LAB — HEMOGLOBIN A1C
Hgb A1c MFr Bld: 4.9 % (ref 4.8–5.6)
Mean Plasma Glucose: 93.93 mg/dL

## 2024-07-25 LAB — BLOOD GAS, VENOUS

## 2024-07-25 LAB — LACTIC ACID, PLASMA: Lactic Acid, Venous: 0.8 mmol/L (ref 0.5–1.9)

## 2024-07-25 LAB — PHOSPHORUS: Phosphorus: 2.4 mg/dL — ABNORMAL LOW (ref 2.5–4.6)

## 2024-07-25 MED ORDER — HEPARIN SODIUM (PORCINE) 5000 UNIT/ML IJ SOLN
5000.0000 [IU] | Freq: Three times a day (TID) | INTRAMUSCULAR | Status: DC
  Administered 2024-07-25 – 2024-07-27 (×4): 5000 [IU] via SUBCUTANEOUS
  Filled 2024-07-25 (×5): qty 1

## 2024-07-25 MED ORDER — SODIUM CHLORIDE 0.9% FLUSH
3.0000 mL | Freq: Two times a day (BID) | INTRAVENOUS | Status: DC
  Administered 2024-07-25 – 2024-07-27 (×3): 3 mL via INTRAVENOUS

## 2024-07-25 MED ORDER — SODIUM CHLORIDE 0.9 % IV BOLUS
1000.0000 mL | Freq: Once | INTRAVENOUS | Status: AC
  Administered 2024-07-25: 1000 mL via INTRAVENOUS

## 2024-07-25 MED ORDER — ACETAMINOPHEN 650 MG RE SUPP: 650.0000 mg | Freq: Four times a day (QID) | RECTAL | Status: DC | PRN

## 2024-07-25 MED ORDER — LACTATED RINGERS IV SOLN: INTRAVENOUS | Status: AC

## 2024-07-25 MED ORDER — ACETAMINOPHEN 325 MG PO TABS: 650.0000 mg | ORAL_TABLET | Freq: Four times a day (QID) | ORAL | Status: DC | PRN

## 2024-07-25 MED ORDER — ACETAMINOPHEN 500 MG PO TABS
1000.0000 mg | ORAL_TABLET | Freq: Once | ORAL | Status: AC
  Administered 2024-07-25: 1000 mg via ORAL
  Filled 2024-07-25: qty 2

## 2024-07-25 NOTE — ED Triage Notes (Signed)
 Pt in via [facility] from work, reports having onset of severe headache, dizziness, blurred vision, nausea this morning while at work.  Onset of symptoms approximately 0930.  Reports hypotension w/ EMS assessment, (+) orthostatic per patient report.    States blurred vision and dizziness has resolved at this time, headache persists.  A/Ox4, neurologically in tact at this time.  Hypotension remains upon arrival.

## 2024-07-25 NOTE — H&P (Addendum)
 History and Physical    [name] FMW:[id number] DOB: 1971/06/26 DOA: 07/25/2024  DOS: the patient was seen and examined on 07/25/2024  PCP: Patient, No Pcp Per   Patient coming from: Home  I have personally briefly reviewed patient's old medical records in [facility] and CareEverywhere  HPI:   Sherry [name] is a 53 y.o. year old female with past medical history of hypertension, diabetes presented to the ED with generalized weakness, blurry vision and nausea and admitted for further workup.   Pt states she developed severe diarrhea on Thursday that prompted her to call out of work with >15 episodes and the improved over the weekend but was still present. Pt states she continued her usual intake and her medications and on Monday she developed weakness and blurry vision. Over the weekend she had postural dizziness. She denies any fevers, chills but states she has neck pain and low back pain that have been chronic.    On arrival to the ED patient was noted to be HDS stable.  Patient noted to have hypotension by EMS and given 1 L IVF. She states her workplace checked her blood pressure and that was low EMS checked it and it was low again. CT head done by EDP shows no acute intracranial findings.  Lab work shows no leukocytosis and no anemia.  CMP with AKI and non-anion gap metabolic acidosis.  Given her AKI and her weakness TRH consulted for admission.  TRH contacted for admission.  Review of Systems: As mentioned in the history of present illness. All other systems reviewed and are negative.   Past Medical History:  Diagnosis Date   Diabetes mellitus without complication (HCC)    Hypertension     Past Surgical History:  Procedure Laterality Date   TRIGGER FINGER RELEASE Right      Allergies  Allergen Reactions   Aspirin Hives    History reviewed. No pertinent family history.  Prior to Admission medications   Medication Sig Start Date End Date Taking? Authorizing  Provider  fluticasone (CUTIVATE) 0.005 % ointment Apply 1 Application topically 2 (two) times daily. 07/25/24  Yes [provider]  ibuprofen  (ADVIL ) 600 MG tablet Take 1 tablet (600 mg total) by mouth every 6 (six) hours as needed. 03/22/23  Yes Francesca Elsie CROME, MD  lidocaine (LIDODERM) 5 % Place 1 patch onto the skin daily. 07/08/24  Yes [provider]  MOUNJARO 2.5 MG/0.5ML Pen Inject 7.5 mg into the skin once a week. 05/08/24  Yes [provider]  Olmesartan-amLODIPine-HCTZ 40-5-12.5 MG TABS Take 1 tablet by mouth daily. 05/09/24  Yes [provider]  ciprofloxacin  (CIPRO ) 500 MG tablet Take 1 tablet (500 mg total) by mouth 2 (two) times daily. As needed for traveler's diarrhea. 12/29/14   Luiz Channel, MD  fluconazole  (DIFLUCAN ) 150 MG tablet Take 1 tablet (150 mg total) by mouth once. 07/15/13   Luiz Channel, MD  metFORMIN (GLUCOPHAGE-XR) 750 MG 24 hr tablet Take 750 mg by mouth daily with breakfast.    [provider]  oxyCODONE  (ROXICODONE ) 5 MG immediate release tablet Take 1 tablet (5 mg total) by mouth every 6 (six) hours as needed for severe pain. Patient not taking: Reported on 07/25/2024 03/22/23   Francesca Elsie CROME, MD    Social History:  reports that she has never smoked. She has never used smokeless tobacco. She reports that she does not drink alcohol and does not use drugs. Lives husband, and grandkids Tobacco- Denies  use. EtOH- Denies use.  Illicit drug use- denies use.  IADLs/ADLs- can perform independently at baseline    Physical Exam: Vitals:   07/25/24 1700 07/25/24 1715 07/25/24 1730 07/25/24 1745  BP: 114/70 109/69 123/79 125/70  Pulse: 80 79 81 86  Resp: 17 17 18 14   Temp:      TempSrc:      SpO2: 100% 99% 100% 100%  Weight:      Height:         Gen: No acute distress HENT: NCAT, dry MM CV: RRR, good pulses in all extremities Resp: CTAB Abd: No TTP abdominal bowel sounds MSK: No symmetry, left lower  back with TTP, patch in place Skin: No lesions noted on skin Neuro: Alert and oriented x 4, CN II-XII intact, good strength in sensation in bilateral extremities. Psych: Normal mood and affect   Labs on Admission: I have personally reviewed following labs and imaging studies  CBC: Recent Labs  Lab 07/25/24 1308  WBC 10.1  HGB 13.2  HCT 42.7  MCV 90.9  PLT 203   Basic Metabolic Panel: Recent Labs  Lab 07/25/24 1308  NA 138  K 4.2  CL 111  CO2 17*  GLUCOSE 111*  BUN 34*  CREATININE 2.20*  CALCIUM 8.7*   GFR: Estimated Creatinine Clearance: 43 mL/min (A) (by C-G formula based on SCr of 2.2 mg/dL (H)). Liver Function Tests: Recent Labs  Lab 07/25/24 1308  AST 18  ALT 15  ALKPHOS 62  BILITOT 0.9  PROT 7.4  ALBUMIN 3.8   No results for input(s): LIPASE, AMYLASE in the last 168 hours. No results for input(s): AMMONIA in the last 168 hours. Coagulation Profile: No results for input(s): INR, PROTIME in the last 168 hours. Cardiac Enzymes: Recent Labs  Lab 07/25/24 1308 07/25/24 1630  TROPONINIHS <2 2   BNP (last 3 results) No results for input(s): BNP in the last 8760 hours. HbA1C: No results for input(s): HGBA1C in the last 72 hours. CBG: No results for input(s): GLUCAP in the last 168 hours. Lipid Profile: No results for input(s): CHOL, HDL, LDLCALC, TRIG, CHOLHDL, LDLDIRECT in the last 72 hours. Thyroid Function Tests: No results for input(s): TSH, T4TOTAL, FREET4, T3FREE, THYROIDAB in the last 72 hours. Anemia Panel: No results for input(s): VITAMINB12, FOLATE, FERRITIN, TIBC, IRON, RETICCTPCT in the last 72 hours. Urine analysis:    Component Value Date/Time   COLORURINE YELLOW 03/12/2011 1511   APPEARANCEUR HAZY (A) 03/12/2011 1511   LABSPEC 1.010 03/12/2011 1511   PHURINE 7.0 03/12/2011 1511   GLUCOSEU NEGATIVE 03/12/2011 1511   HGBUR TRACE (A) 03/12/2011 1511   BILIRUBINUR NEGATIVE 03/12/2011  1511   KETONESUR 15 (A) 03/12/2011 1511   PROTEINUR NEGATIVE 03/12/2011 1511   UROBILINOGEN 0.2 03/12/2011 1511   NITRITE NEGATIVE 03/12/2011 1511   LEUKOCYTESUR SMALL (A) 03/12/2011 1511    Radiological Exams on Admission: I have personally reviewed images CT Head Wo Contrast Result Date: 07/25/2024 CLINICAL DATA:  Sudden onset of severe headache. EXAM: CT HEAD WITHOUT CONTRAST TECHNIQUE: Contiguous axial images were obtained from the base of the skull through the vertex without intravenous contrast. RADIATION DOSE REDUCTION: This exam was performed according to the departmental dose-optimization program which includes automated exposure control, adjustment of the mA and/or kV according to patient size and/or use of iterative reconstruction technique. COMPARISON:  None Available. FINDINGS: Brain: No intracranial hemorrhage, mass effect, or midline shift. No hydrocephalus. The basilar cisterns are patent. No evidence of territorial infarct or  acute ischemia. No extra-axial or intracranial fluid collection. Vascular: No hyperdense vessel or unexpected calcification. Skull: No fracture or focal lesion. Sinuses/Orbits: Paranasal sinuses and mastoid air cells are clear. The visualized orbits are unremarkable. Other: None. IMPRESSION: Negative noncontrast head CT. Electronically Signed   By: Andrea Gasman M.D.   On: 07/25/2024 15:46    EKG: My personal interpretation of EKG shows: NSR without any acute ST changes.  Low voltage noted.   Assessment/Plan Principal Problem:   Acute kidney injury Active Problems:   Essential hypertension, benign   Type 2 diabetes mellitus (HCC)   Patient with significant AKI likely secondary to hypotension which is secondary to hypovolemia in the setting of recent diarrheal illness.  Patient continued to take her blood pressure medication which is a combo pill despite her GI illness which resulted in hypotension leading to majority of her symptoms.  Patient is  status post 1 L and will continue getting maintenance fluids at 125 cc/h.  Will repeat renal function in a.m. will also check magnesium and phosphorus level.  Will defer getting her renal ultrasound as I am less concerned about hydronephrosis or others causes currently.  But if her creatinine fails to improve then we will definitely get renal ultrasound.  She may have some baseline renal dysfunction but I do not see a recent BMP done.  She states she follows up regularly with her PCP at [facility] primary [facility].  Will monitor renal function, avoid nephrotoxic agents, renally dose medications.  Dizziness/blurry vision: Suspect secondary to hypotension as they have resolved with IVF.  Initial concern was for TIA by EDP but I do not think she had a TIA as her symptoms were not localized to a vascular territory.  She had weakness, blurry vision and dizziness that can be seen in hypotension.  Would like to get CTA head and neck to see if she has any stenosis but currently unable to given her AKI.  Will check her lipid panel, A1c and monitor on telemetry but her diabetes and hypertension are well-controlled per her report.  She may benefit from statin given her diabetes and have discussed this with the patient.  Hypertension: Holding home medications currently.  Type 2 diabetes: Will monitor CBG and start SSI if needed.   VTE prophylaxis:  Lovenox  Diet: Heart healthy Code Status:  Full Code Telemetry:  Admission status: Inpt, telemetry Patient is from: Home Anticipated d/c is to: Home Anticipated d/c is in: 1 days   Family Communication: Updated at bedside  Consults called: None   Severity of Illness: The appropriate patient status for this patient is INPATIENT. Inpatient status is judged to be reasonable and necessary in order to provide the required intensity of service to ensure the patient's safety. The patient's presenting symptoms, physical exam findings, and initial radiographic and laboratory  data in the context of their chronic comorbidities is felt to place them at high risk for further clinical deterioration. Furthermore, it is not anticipated that the patient will be medically stable for discharge from the hospital within 2 midnights of admission.   * I certify that at the point of admission it is my clinical judgment that the patient will require inpatient hospital care spanning beyond 2 midnights from the point of admission due to high intensity of service, high risk for further deterioration and high frequency of surveillance required.DEWAINE Morene Bathe, MD [place] DEL. [facility]

## 2024-07-25 NOTE — ED Triage Notes (Signed)
 Pt comes via EMS from work with dizziness and severe headache. Pt states nausea that all started at 930 today. Pt had diarrhea last week.  80/43 110 CBG 103  Last bp 110/47  Pt has 18 in lac and pt got some fluids. Pt got 4 zofran.

## 2024-07-25 NOTE — ED Notes (Signed)
 Patient presentation discussed w/ EDP, Paduchowski; see new order.  Patient to receive next available bed per First Nurse.

## 2024-07-25 NOTE — ED Provider Notes (Signed)
 Clinical Course as of 07/25/24 2346  Mon Jul 25, 2024  1527 Received signout. AT work, developed headache and generalized weakness. Became nauseated. Blurred vision. Hypotensive on arrival. New AKI. Getting CVA workup. PMH htn, headaches. Not a code stroke. NIHSS 0 [HD]  1613 I reassessed patient and patient is improved.  Understands the need for admission today will call the patient and to the hospitalist [HD]  1657 Case discussed with hospitalist for AKI and TIA workup [HD]    Clinical Course User Index [HD] Nicholaus Rolland BRAVO, MD      Nicholaus Rolland BRAVO, MD 07/25/24 [phone]

## 2024-07-25 NOTE — ED Provider Notes (Signed)
 [facility] Provider Note    Event Date/Time   First MD Initiated Contact with Patient 07/25/24 1544     (approximate)  History   Chief Complaint: Dizziness  HPI  Sherry [name] is a 53 y.o. female with a past medical history of diabetes, hypertension who presents to the emergency department from work with acute onset of headache generalized weakness states some blurred vision and nausea.  According to the patient this morning she was having an upset stomach felt like she had to have diarrhea.  States she was feeling somewhat weak and nauseated.  She was at work when she began experiencing a headache.  States she was going to go home and lay down but her boss made her come to the emergency department to be evaluated.  Here the patient is found to be hypotensive 84/46 on arrival.  Patient denies any focal weakness of any arm or leg no confusion or speech difficulties.  Patient is alert and orient x 4 gives a great history.  Denies any fever cough congestion no chest pain no abdominal pain.  Physical Exam   Triage Vital Signs: ED Triage Vitals  Encounter Vitals Group     BP 07/25/24 1303 (!) 84/46     Girls Systolic BP Percentile --      Girls Diastolic BP Percentile --      Boys Systolic BP Percentile --      Boys Diastolic BP Percentile --      Pulse Rate 07/25/24 1303 86     Resp 07/25/24 1303 15     Temp 07/25/24 1303 97.8 F (36.6 C)     Temp Source 07/25/24 1303 Oral     SpO2 07/25/24 1303 100 %     Weight 07/25/24 1306 270 lb (122.5 kg)     Height 07/25/24 1306 5' 10 (1.778 m)     Head Circumference --      Peak Flow --      Pain Score 07/25/24 1305 8     Pain Loc --      Pain Education --      Exclude from Growth Chart --     Most recent vital signs: Vitals:   07/25/24 1415 07/25/24 1530  BP: (!) 105/58 (!) 109/92  Pulse: 78 78  Resp: (!) 27 14  Temp:    SpO2: 100% 100%    General: Awake, no distress.  CV:  Good peripheral perfusion.   Regular rate and rhythm  Resp:  Normal effort.  Equal breath sounds bilaterally.  Abd:  No distention.  Soft, nontender.  No rebound or guarding.  Benign abdomen.  ED Results / Procedures / Treatments   EKG  EKG viewed and interpreted by myself shows a normal sinus rhythm at 78 bpm with a narrow QRS, left axis deviation, largely normal intervals with no concerning ST changes.  RADIOLOGY  I have reviewed interpret the CT head images.  No large bleed or significant abnormality seen on my evaluation of the images. Radiology has read the CT scan as negative.   MEDICATIONS ORDERED IN ED: Medications  sodium chloride 0.9 % bolus 1,000 mL (1,000 mLs Intravenous New Bag/Given 07/25/24 1358)  acetaminophen  (TYLENOL ) tablet 1,000 mg (1,000 mg Oral Given 07/25/24 1352)     IMPRESSION / MDM / ASSESSMENT AND PLAN / ED COURSE  I reviewed the triage vital signs and the nursing notes.  Patient's presentation is most consistent with acute presentation with potential threat to  life or bodily function.  Patient presents to the emergency department for generalized weakness headache nausea.  Patient found to be hypotensive in the 80s on arrival.  After 1 L of normal saline patient's blood pressure is now increased to 109/92.  Patient's lab work today shows a reassuring CBC, reassuring troponin at 2 and a chemistry showing acute kidney injury.  Patient's last labs although were years ago showed normal renal function.  Patient states she was recently taking off of her meloxicam because her doctor was concerned over possible kidney dysfunction.  I reviewed the patient's labs here as well as care everywhere and we do not have any more recent lab work patient.  However given the patient's acute kidney injury weakness initial hypotension we will admit to the hospital service once remainder of her workup has resulted.  Patient is agreeable to this plan.  FINAL CLINICAL IMPRESSION(S) / ED DIAGNOSES    Weakness Acute kidney injury Hypotension   Note:  This document was prepared using Dragon voice recognition software and may include unintentional dictation errors.   Dorothyann Drivers, MD 07/25/24 1553

## 2024-07-25 NOTE — ED Notes (Signed)
 Called CCMD to add pt to monitoring.

## 2024-07-26 DIAGNOSIS — E861 Hypovolemia: Secondary | ICD-10-CM | POA: Diagnosis not present

## 2024-07-26 DIAGNOSIS — N179 Acute kidney failure, unspecified: Secondary | ICD-10-CM | POA: Diagnosis not present

## 2024-07-26 DIAGNOSIS — I10 Essential (primary) hypertension: Secondary | ICD-10-CM

## 2024-07-26 DIAGNOSIS — E1169 Type 2 diabetes mellitus with other specified complication: Secondary | ICD-10-CM | POA: Diagnosis not present

## 2024-07-26 LAB — BASIC METABOLIC PANEL WITH GFR
Anion gap: 7 (ref 5–15)
BUN: 25 mg/dL — ABNORMAL HIGH (ref 6–20)
CO2: 20 mmol/L — ABNORMAL LOW (ref 22–32)
Calcium: 8.3 mg/dL — ABNORMAL LOW (ref 8.9–10.3)
Chloride: 111 mmol/L (ref 98–111)
Creatinine, Ser: 1.41 mg/dL — ABNORMAL HIGH (ref 0.44–1.00)
GFR, Estimated: 45 mL/min — ABNORMAL LOW (ref >60)
Glucose, Bld: 97 mg/dL (ref 70–99)
Potassium: 3.7 mmol/L (ref 3.5–5.1)
Sodium: 138 mmol/L (ref 135–145)

## 2024-07-26 LAB — GLUCOSE, CAPILLARY
Glucose-Capillary: 78 mg/dL (ref 70–99)
Glucose-Capillary: 79 mg/dL (ref 70–99)
Glucose-Capillary: 84 mg/dL (ref 70–99)

## 2024-07-26 LAB — CBC
HCT: 37.2 % (ref 36.0–46.0)
Hemoglobin: 12 g/dL (ref 12.0–15.0)
MCH: 28.6 pg (ref 26.0–34.0)
MCHC: 32.3 g/dL (ref 30.0–36.0)
MCV: 88.8 fL (ref 80.0–100.0)
Platelets: 192 K/uL (ref 150–400)
RBC: 4.19 MIL/uL (ref 3.87–5.11)
RDW: 13.5 % (ref 11.5–15.5)
WBC: 5.8 K/uL (ref 4.0–10.5)
nRBC: 0 % (ref 0.0–0.2)

## 2024-07-26 LAB — HIV ANTIBODY (ROUTINE TESTING W REFLEX): HIV Screen 4th Generation wRfx: NONREACTIVE

## 2024-07-26 MED ORDER — SODIUM CHLORIDE 0.9 % IV SOLN: INTRAVENOUS | Status: DC

## 2024-07-26 MED ORDER — CYCLOBENZAPRINE HCL 10 MG PO TABS
5.0000 mg | ORAL_TABLET | Freq: Three times a day (TID) | ORAL | Status: DC | PRN
  Administered 2024-07-26: 5 mg via ORAL
  Filled 2024-07-26: qty 1

## 2024-07-26 MED ORDER — SODIUM CHLORIDE 0.9 % IV BOLUS
1000.0000 mL | Freq: Once | INTRAVENOUS | Status: AC
  Administered 2024-07-26: 1000 mL via INTRAVENOUS

## 2024-07-26 MED ORDER — MIDODRINE HCL 5 MG PO TABS
5.0000 mg | ORAL_TABLET | Freq: Three times a day (TID) | ORAL | Status: DC
  Administered 2024-07-26 (×2): 5 mg via ORAL
  Filled 2024-07-26 (×2): qty 1

## 2024-07-26 NOTE — Progress Notes (Signed)
 Mobility Specialist Progress Note:    07/26/24 1357  Mobility  Activity Ambulated with assistance  Level of Assistance Modified independent, requires aide device or extra time  Assistive Device None  Distance Ambulated (ft) 160 ft  Range of Motion/Exercises Active;All extremities  Activity Response Tolerated well  Mobility visit 1 Mobility  Mobility Specialist Start Time (ACUTE ONLY) 1330  Mobility Specialist Stop Time (ACUTE ONLY) 1346  Mobility Specialist Time Calculation (min) (ACUTE ONLY) 16 min   Pt received in bed, husband at bedside. Agreeable to mobility, ModI to stand and ambulate with no AD. Tolerated well, denies dizziness but c/o back pain rated 12/10. Returned to room, all needs met.  Sherrilee Ditty Mobility Specialist Please contact via Special Educational Needs Teacher or  Rehab office at [phone]

## 2024-07-26 NOTE — Discharge Instructions (Signed)
 [facility] Primary Care Provider List  [facility] at [facility] [address] [place], [place] [address] [phone]  [facility] at [facility] [address], [place], [place] [address] [phone]  [facility] Patient [facility] [address] [place], [place], [place] [address] [phone]  [facility] Care at [facility] [address], [place], [place] [address] [phone]  [facility] at [facility] [address], [address], [place], [place] [address] [phone]  [facility] [address] [place], [place], [place] [address] [phone]  [facility] [address], [place], [place] [address] [phone]  [facility] [address], [place], [place] [address] [phone]  [facility] [address], [place], [place] [address] [phone]  [facility] [address], [place], [place] [address] [phone]  [facility] [address] [place], [address], [place], [place] [address] [phone]  [facility] [address] [place], [place] [address] [phone]  [facility] at [facility] [address] [place], [place], [place] [address] [phone]  [facility] at [facility] [address], [place], [place] [address] [phone]  [facility] at [facility] [address] [place], [place] [address] [phone]

## 2024-07-26 NOTE — Plan of Care (Signed)

## 2024-07-26 NOTE — Progress Notes (Signed)
 PROGRESS NOTE    Sherry [name]  FMW:[id number] DOB: 1971/07/18 DOA: 07/25/2024 PCP: Patient, No Pcp Per  Brief Narrative:    53 y.o. year old female with past medical history of hypertension, diabetes presented to the ED with generalized weakness, blurry vision and nausea and admitted for further workup.   10/28:started midodrine, check orthostatics, IVFs   Assessment & Plan:   Principal Problem:   Acute kidney injury Active Problems:   Essential hypertension, benign   Type 2 diabetes mellitus (HCC)  AKI Multifactorial - possible ATN due to hypotension secondary to hypovolemia in the setting of recent diarrheal illness.  Patient continued to take her blood pressure medication which is a combo pill (3 meds with hydrochlorothiazide, Norvasc and ARB) despite her GI illness which resulted in hypotension leading to majority of her symptoms.   Also on metformin - Diarrhea could be due to Mounjaro or viral illness, she had 3 loose BM. Will check stool GI Panel - Holding all above meds - hydrate and monitor renal function - if no improvement - consider renal ultrasound -avoid nephrotoxic agents, renally dose medications.   Transient Dizziness/blurry vision: Suspect secondary to hypotension as they have resolved with IVF.  Initial concern was for TIA by EDP but I do not think she had a TIA as her symptoms were not localized to a vascular territory.  She had weakness, blurry vision and dizziness that can be seen in hypotension.    Hypotension with h/o Hypertension: Holding home medication - ombo pill (3 meds with hydrochlorothiazide, Norvasc and ARB) currently. Hydrate with IVF and start midodrine Ambulate and check orthostatics   Type 2 diabetes: Will monitor CBG and start SSI if needed. Sugars well controlled for now.   DVT prophylaxis: Heparin heparin injection 5,000 Units Start: 07/25/24 2200     Code Status: Full Code Family Communication: updated Husband and daughter at  bedside Disposition Plan: in next 1-2 days depending on clinical condition      Subjective:  Reports 3 loose BMs today and not having much symptoms. Family at bedside  Objective: Vitals:   07/26/24 1025 07/26/24 1400 07/26/24 1508 07/26/24 1536  BP: (!) 88/50 102/67 99/64 (!) 94/57  Pulse:  78 80 84  Resp:    16  Temp:    98.3 F (36.8 C)  TempSrc:      SpO2:  100%  99%  Weight:      Height:        Intake/Output Summary (Last 24 hours) at 07/26/2024 1813 Last data filed at 07/26/2024 1704 Gross per 24 hour  Intake 2885.06 ml  Output --  Net 2885.06 ml   Filed Weights   07/25/24 1306  Weight: 122.5 kg    Examination:  General exam: Appears calm and comfortable  Respiratory system: Clear to auscultation. Respiratory effort normal. Cardiovascular system: S1 & S2 heard, RRR. No murmurs. No pedal edema. Gastrointestinal system: Abdomen is soft, benign Central nervous system: Alert and oriented. No focal neurological deficits. Extremities: Symmetric 5 x 5 power. Skin: No rashes, lesions or ulcers Psychiatry: Judgement and insight appear normal. Mood & affect appropriate.     Data Reviewed: I have personally reviewed following labs and imaging studies  CBC: Recent Labs  Lab 07/25/24 1308 07/26/24 0530  WBC 10.1 5.8  HGB 13.2 12.0  HCT 42.7 37.2  MCV 90.9 88.8  PLT 203 192   Basic Metabolic Panel: Recent Labs  Lab 07/25/24 1308 07/25/24 1916 07/26/24 0530  NA 138  --  138  K 4.2  --  3.7  CL 111  --  111  CO2 17*  --  20*  GLUCOSE 111*  --  97  BUN 34*  --  25*  CREATININE 2.20*  --  1.41*  CALCIUM 8.7*  --  8.3*  MG  --  1.9  --   PHOS  --  2.4*  --    GFR: Estimated Creatinine Clearance: 67.1 mL/min (A) (by C-G formula based on SCr of 1.41 mg/dL (H)). Liver Function Tests: Recent Labs  Lab 07/25/24 1308  AST 18  ALT 15  ALKPHOS 62  BILITOT 0.9  PROT 7.4  ALBUMIN 3.8    HbA1C: Recent Labs    07/25/24 1308  HGBA1C 4.9    CBG: Recent Labs  Lab 07/26/24 0756 07/26/24 1242  GLUCAP 78 79   Lipid Profile: Recent Labs    07/25/24 1308  CHOL 151  HDL 32*  LDLCALC 95  TRIG 877  CHOLHDL 4.7    Sepsis Labs: Recent Labs  Lab 07/25/24 2230  LATICACIDVEN 0.8    No results found for this or any previous visit (from the past 240 hours).       Radiology Studies: CT Head Wo Contrast Result Date: 07/25/2024 CLINICAL DATA:  Sudden onset of severe headache. EXAM: CT HEAD WITHOUT CONTRAST TECHNIQUE: Contiguous axial images were obtained from the base of the skull through the vertex without intravenous contrast. RADIATION DOSE REDUCTION: This exam was performed according to the departmental dose-optimization program which includes automated exposure control, adjustment of the mA and/or kV according to patient size and/or use of iterative reconstruction technique. COMPARISON:  None Available. FINDINGS: Brain: No intracranial hemorrhage, mass effect, or midline shift. No hydrocephalus. The basilar cisterns are patent. No evidence of territorial infarct or acute ischemia. No extra-axial or intracranial fluid collection. Vascular: No hyperdense vessel or unexpected calcification. Skull: No fracture or focal lesion. Sinuses/Orbits: Paranasal sinuses and mastoid air cells are clear. The visualized orbits are unremarkable. Other: None. IMPRESSION: Negative noncontrast head CT. Electronically Signed   By: Andrea Gasman M.D.   On: 07/25/2024 15:46        Scheduled Meds:  heparin  5,000 Units Subcutaneous Q8H   midodrine  5 mg Oral TID WC   sodium chloride flush  3 mL Intravenous Q12H   Continuous Infusions:   LOS: 1 day    Time spent: 35 mins    Ruble Pumphrey Maree, MD [facility] Pager [phone]  If 7PM-7AM, please contact night-coverage [email or web address]  07/26/2024, 6:13 PM

## 2024-07-26 NOTE — TOC CM/SW Note (Signed)
 Transition of Care [facility]) - Inpatient Brief Assessment   Patient Details  Name: Sherry [name] MRN: [id number] Date of Birth: Feb 24, 1971  Transition of Care [facility]) CM/SW Contact:    Corean ONEIDA Haddock, RN Phone Number: 07/26/2024, 9:31 AM   Clinical Narrative:   Transition of Care [facility]) Screening Note   Patient Details  Name: Sherry [name] Date of Birth: Feb 05, 1971   Transition of Care [facility]) CM/SW Contact:    Corean ONEIDA Haddock, RN Phone Number: 07/26/2024, 9:31 AM    Transition of Care Department [facility]) has reviewed patient and no TOC needs have been identified at this time.  If new patient transition needs arise, please place a TOC consult.     Transition of Care Asessment: Insurance and Status: Insurance coverage has been reviewed Patient has primary care physician: No (List of local PCP added to AVS)     Prior/Current Home Services: No current home services Social Drivers of Health Review: SDOH reviewed no interventions necessary Readmission risk has been reviewed: Yes Transition of care needs: no transition of care needs at this time

## 2024-07-27 DIAGNOSIS — E66812 Obesity, class 2: Secondary | ICD-10-CM | POA: Insufficient documentation

## 2024-07-27 DIAGNOSIS — E872 Acidosis, unspecified: Secondary | ICD-10-CM | POA: Insufficient documentation

## 2024-07-27 DIAGNOSIS — I10 Essential (primary) hypertension: Secondary | ICD-10-CM | POA: Diagnosis not present

## 2024-07-27 DIAGNOSIS — N179 Acute kidney failure, unspecified: Secondary | ICD-10-CM | POA: Diagnosis not present

## 2024-07-27 LAB — CBC
HCT: 37.9 % (ref 36.0–46.0)
Hemoglobin: 12.2 g/dL (ref 12.0–15.0)
MCH: 28.5 pg (ref 26.0–34.0)
MCHC: 32.2 g/dL (ref 30.0–36.0)
MCV: 88.6 fL (ref 80.0–100.0)
Platelets: 183 K/uL (ref 150–400)
RBC: 4.28 MIL/uL (ref 3.87–5.11)
RDW: 13.5 % (ref 11.5–15.5)
WBC: 7.6 K/uL (ref 4.0–10.5)
nRBC: 0 % (ref 0.0–0.2)

## 2024-07-27 LAB — BASIC METABOLIC PANEL WITH GFR
Anion gap: 12 (ref 5–15)
BUN: 22 mg/dL — ABNORMAL HIGH (ref 6–20)
CO2: 19 mmol/L — ABNORMAL LOW (ref 22–32)
Calcium: 8.3 mg/dL — ABNORMAL LOW (ref 8.9–10.3)
Chloride: 108 mmol/L (ref 98–111)
Creatinine, Ser: 1.31 mg/dL — ABNORMAL HIGH (ref 0.44–1.00)
GFR, Estimated: 49 mL/min — ABNORMAL LOW (ref >60)
Glucose, Bld: 94 mg/dL (ref 70–99)
Potassium: 3.9 mmol/L (ref 3.5–5.1)
Sodium: 139 mmol/L (ref 135–145)

## 2024-07-27 LAB — PHOSPHORUS: Phosphorus: 2.2 mg/dL — ABNORMAL LOW (ref 2.5–4.6)

## 2024-07-27 LAB — MAGNESIUM: Magnesium: 2 mg/dL (ref 1.7–2.4)

## 2024-07-27 MED ORDER — SODIUM BICARBONATE 650 MG PO TABS
1300.0000 mg | ORAL_TABLET | Freq: Two times a day (BID) | ORAL | Status: DC
  Administered 2024-07-27: 1300 mg via ORAL
  Filled 2024-07-27: qty 2

## 2024-07-27 MED ORDER — SODIUM BICARBONATE 650 MG PO TABS: 1300.0000 mg | ORAL_TABLET | Freq: Two times a day (BID) | ORAL | 0 refills | Status: AC

## 2024-07-27 MED ORDER — K PHOS MONO-SOD PHOS DI & MONO 155-852-130 MG PO TABS
500.0000 mg | ORAL_TABLET | Freq: Once | ORAL | Status: AC
  Administered 2024-07-27: 500 mg via ORAL
  Filled 2024-07-27: qty 2

## 2024-07-27 NOTE — Discharge Summary (Signed)
 Physician Discharge Summary   Patient: Sherry [name] MRN: [id number] DOB: 08-18-1971  Admit date:     07/25/2024  Discharge date: 07/27/24  Discharge Physician: Murvin Mana   PCP: Patient, No Pcp Per   Recommendations at discharge:   Follow-up with PCP in 1 week, TOC to arrange  Discharge Diagnoses: Principal Problem:   Acute kidney injury Active Problems:   Essential hypertension, benign   Type 2 diabetes mellitus (HCC)   Class 2 obesity   Metabolic acidosis  Resolved Problems:   * No resolved hospital problems. *  Hospital Course: 53 y.o. year old female with past medical history of hypertension, diabetes presented to the ED with generalized weakness, blurry vision and nausea and admitted for further workup.  Patient appeared to have acute gastroenteritis, she was given fluids, symptomatic treatment.  Diarrhea resolved, no bowel movement today.  No nausea or vomiting, tolerating diet.  Blood pressure much better today.  Patient medically stable for discharge.  Assessment and Plan: Acute gastroenteritis probably viral in origin. Acute kidney injury secondary to dehydration with nausea vomiting. Orthostatic hypotension/hypotension secondary to dehydration. Patient received IV fluids, condition much improved.  Renal function close to normal.  At this point, patient is medically stable for discharge. TOC to arrange for follow-up with PCP.  Hypophosphatemia. Metabolic acidosis. Phos level 2.2, will give 500 mg of Neutra Phos before discharge. Also prescribed sodium bicarb 30 mg twice a day for 4 days.  Essential hypertension. Hold off blood pressure medicines.  Restart after seen by PCP.  Type 2 diabetes. Hold off metformin for recent diarrhea.  Class II obesity with BMI 38.74. Diet and excise.        Consultants: None Procedures performed: None  Disposition: Home Diet recommendation:  Discharge Diet Orders (From admission, onward)     Start     Ordered    07/27/24 0000  Diet - low sodium heart healthy        07/27/24 1028           Cardiac diet DISCHARGE MEDICATION: Allergies as of 07/27/2024       Reactions   Aspirin Hives        Medication List     STOP taking these medications    ciprofloxacin  500 MG tablet Commonly known as: CIPRO    fluconazole  150 MG tablet Commonly known as: Diflucan    fluticasone 0.005 % ointment Commonly known as: CUTIVATE   ibuprofen  600 MG tablet Commonly known as: ADVIL    metFORMIN 750 MG 24 hr tablet Commonly known as: GLUCOPHAGE-XR   Olmesartan-amLODIPine-HCTZ 40-5-12.5 MG Tabs   oxyCODONE  5 MG immediate release tablet Commonly known as: Roxicodone        TAKE these medications    lidocaine 5 % Commonly known as: LIDODERM Place 1 patch onto the skin daily.   Mounjaro 2.5 MG/0.5ML Pen Generic drug: tirzepatide Inject 7.5 mg into the skin once a week.   multivitamin with minerals Tabs tablet Take 1 tablet by mouth daily.   sodium bicarbonate 650 MG tablet Take 2 tablets (1,300 mg total) by mouth 2 (two) times daily for 4 days.        Discharge Exam: Filed Weights   07/25/24 1306  Weight: 122.5 kg   General exam: Appears calm and comfortable  Respiratory system: Clear to auscultation. Respiratory effort normal. Cardiovascular system: S1 & S2 heard, RRR. No JVD, murmurs, rubs, gallops or clicks. No pedal edema. Gastrointestinal system: Abdomen is nondistended, soft and nontender. No organomegaly or masses felt.  Normal bowel sounds heard. Central nervous system: Alert and oriented. No focal neurological deficits. Extremities: Symmetric 5 x 5 power. Skin: No rashes, lesions or ulcers Psychiatry: Judgement and insight appear normal. Mood & affect appropriate.    Condition at discharge: good  The results of significant diagnostics from this hospitalization (including imaging, microbiology, ancillary and laboratory) are listed below for reference.   Imaging  Studies: CT Head Wo Contrast Result Date: 07/25/2024 CLINICAL DATA:  Sudden onset of severe headache. EXAM: CT HEAD WITHOUT CONTRAST TECHNIQUE: Contiguous axial images were obtained from the base of the skull through the vertex without intravenous contrast. RADIATION DOSE REDUCTION: This exam was performed according to the departmental dose-optimization program which includes automated exposure control, adjustment of the mA and/or kV according to patient size and/or use of iterative reconstruction technique. COMPARISON:  None Available. FINDINGS: Brain: No intracranial hemorrhage, mass effect, or midline shift. No hydrocephalus. The basilar cisterns are patent. No evidence of territorial infarct or acute ischemia. No extra-axial or intracranial fluid collection. Vascular: No hyperdense vessel or unexpected calcification. Skull: No fracture or focal lesion. Sinuses/Orbits: Paranasal sinuses and mastoid air cells are clear. The visualized orbits are unremarkable. Other: None. IMPRESSION: Negative noncontrast head CT. Electronically Signed   By: Andrea Gasman M.D.   On: 07/25/2024 15:46    Microbiology: Results for orders placed or performed during the hospital encounter of 03/12/11  Urine culture     Status: None   Collection Time: 03/12/11  3:11 PM   Specimen: Urine, Clean Catch  Result Value Ref Range Status   Specimen Description URINE, CLEAN CATCH  Final   Special Requests IMMUNE:NORM UT SYMPT:NEG  Final   Culture  Setup Time [id number]  Final   Colony Count 80,000 COLONIES/ML  Final   Culture   Final    Multiple bacterial morphotypes present, none predominant. Suggest appropriate recollection if clinically indicated.   Report Status 03/13/2011 FINAL  Final  Strep B DNA probe     Status: None   Collection Time: 03/12/11  8:50 PM   Specimen: Vaginal/Rectal  Result Value Ref Range Status   Specimen Description VAGINAL/RECTAL  Final   Special Requests NONE  Final   Strep Group B Ag  POSITIVE  Final   Report Status 03/14/2011 FINAL  Final    Labs: CBC: Recent Labs  Lab 07/25/24 1308 07/26/24 0530 07/27/24 0508  WBC 10.1 5.8 7.6  HGB 13.2 12.0 12.2  HCT 42.7 37.2 37.9  MCV 90.9 88.8 88.6  PLT 203 192 183   Basic Metabolic Panel: Recent Labs  Lab 07/25/24 1308 07/25/24 1916 07/26/24 0530 07/27/24 0508  NA 138  --  138 139  K 4.2  --  3.7 3.9  CL 111  --  111 108  CO2 17*  --  20* 19*  GLUCOSE 111*  --  97 94  BUN 34*  --  25* 22*  CREATININE 2.20*  --  1.41* 1.31*  CALCIUM 8.7*  --  8.3* 8.3*  MG  --  1.9  --  2.0  PHOS  --  2.4*  --  2.2*   Liver Function Tests: Recent Labs  Lab 07/25/24 1308  AST 18  ALT 15  ALKPHOS 62  BILITOT 0.9  PROT 7.4  ALBUMIN 3.8   CBG: Recent Labs  Lab 07/26/24 0756 07/26/24 1242 07/26/24 2111  GLUCAP 78 79 84    Discharge time spent: 35 minutes.  Signed: Murvin Mana, MD [facility] 07/27/2024

## 2024-07-27 NOTE — Plan of Care (Signed)

## 2024-07-29 LAB — BLOOD GAS, VENOUS
Acid-base deficit: 6.4 mmol/L — ABNORMAL HIGH (ref 0.0–2.0)
Bicarbonate: 19.1 mmol/L — ABNORMAL LOW (ref 20.0–28.0)
O2 Saturation: 44.5 %
Patient temperature: 37
pCO2, Ven: 37 mmHg — ABNORMAL LOW (ref 44–60)
pH, Ven: 7.32 (ref 7.25–7.43)

## 2024-07-30 LAB — NOROVIRUS GROUP 1 & 2 BY PCR, STOOL

## 2024-08-10 ENCOUNTER — Other Ambulatory Visit: Payer: Self-pay | Admitting: Orthopedic Surgery

## 2024-08-10 DIAGNOSIS — M533 Sacrococcygeal disorders, not elsewhere classified: Secondary | ICD-10-CM

## 2024-08-10 DIAGNOSIS — M545 Low back pain, unspecified: Secondary | ICD-10-CM

## 2024-08-29 ENCOUNTER — Ambulatory Visit
Admission: RE | Admit: 2024-08-29 | Discharge: 2024-08-29 | Disposition: A | Source: Ambulatory Visit | Attending: Orthopedic Surgery | Admitting: Orthopedic Surgery

## 2024-08-29 DIAGNOSIS — M533 Sacrococcygeal disorders, not elsewhere classified: Secondary | ICD-10-CM
